# Patient Record
Sex: Female | Born: 1935 | Race: White | Hispanic: No | Marital: Married | Smoking: Never smoker
Health system: Southern US, Community
[De-identification: ages and names within clinical notes are randomized; demographics above are authoritative.]

## PROBLEM LIST (undated history)

## (undated) DIAGNOSIS — C4491 Basal cell carcinoma of skin, unspecified: Secondary | ICD-10-CM

## (undated) DIAGNOSIS — C4492 Squamous cell carcinoma of skin, unspecified: Secondary | ICD-10-CM

## (undated) DIAGNOSIS — E785 Hyperlipidemia, unspecified: Secondary | ICD-10-CM

## (undated) DIAGNOSIS — N200 Calculus of kidney: Secondary | ICD-10-CM

## (undated) DIAGNOSIS — I10 Essential (primary) hypertension: Secondary | ICD-10-CM

## (undated) DIAGNOSIS — C4490 Unspecified malignant neoplasm of skin, unspecified: Secondary | ICD-10-CM

## (undated) HISTORY — DX: Calculus of kidney: N20.0

## (undated) HISTORY — DX: Basal cell carcinoma of skin, unspecified: C44.91

## (undated) HISTORY — PX: CATARACT EXTRACTION: SUR2

## (undated) HISTORY — PX: OOPHORECTOMY: SHX86

## (undated) HISTORY — PX: BUNIONECTOMY: SHX129

## (undated) HISTORY — PX: ABDOMINAL HYSTERECTOMY: SHX81

## (undated) HISTORY — DX: Squamous cell carcinoma of skin, unspecified: C44.92

## (undated) HISTORY — DX: Hyperlipidemia, unspecified: E78.5

## (undated) HISTORY — DX: Essential (primary) hypertension: I10

## (undated) HISTORY — DX: Unspecified malignant neoplasm of skin, unspecified: C44.90

---

## 2004-01-14 ENCOUNTER — Ambulatory Visit: Payer: Self-pay | Admitting: Internal Medicine

## 2005-02-17 ENCOUNTER — Ambulatory Visit: Payer: Self-pay | Admitting: Internal Medicine

## 2006-03-02 ENCOUNTER — Ambulatory Visit: Payer: Self-pay | Admitting: Internal Medicine

## 2006-06-07 ENCOUNTER — Ambulatory Visit: Payer: Self-pay | Admitting: Gastroenterology

## 2007-03-04 ENCOUNTER — Ambulatory Visit: Payer: Self-pay | Admitting: Internal Medicine

## 2008-03-06 ENCOUNTER — Ambulatory Visit: Payer: Self-pay | Admitting: Internal Medicine

## 2009-03-07 ENCOUNTER — Ambulatory Visit: Payer: Self-pay | Admitting: Internal Medicine

## 2010-03-13 ENCOUNTER — Ambulatory Visit: Payer: Self-pay | Admitting: Internal Medicine

## 2011-02-10 ENCOUNTER — Ambulatory Visit: Payer: Self-pay | Admitting: Internal Medicine

## 2011-03-25 ENCOUNTER — Ambulatory Visit: Payer: Self-pay | Admitting: Internal Medicine

## 2012-03-29 ENCOUNTER — Ambulatory Visit: Payer: Self-pay

## 2012-08-01 ENCOUNTER — Ambulatory Visit: Payer: Self-pay

## 2012-08-02 LAB — URINALYSIS, COMPLETE
Bilirubin,UR: NEGATIVE
Glucose,UR: NEGATIVE mg/dL (ref 0–75)
Ketone: NEGATIVE
Nitrite: NEGATIVE
RBC,UR: 8 /HPF (ref 0–5)
Specific Gravity: 1.02 (ref 1.003–1.030)
Squamous Epithelial: 3
Transitional Epi: <1
WBC UR: 59 /HPF (ref 0–5)

## 2012-08-02 LAB — CBC WITH DIFFERENTIAL/PLATELET
Eosinophil #: 0.1 10*3/uL (ref 0.0–0.7)
Eosinophil %: 1 %
HCT: 38.1 % (ref 35.0–47.0)
HGB: 12.9 g/dL (ref 12.0–16.0)
Lymphocyte #: 2.3 10*3/uL (ref 1.0–3.6)
Lymphocyte %: 24.8 %
MCHC: 33.9 g/dL (ref 32.0–36.0)
Neutrophil %: 64.6 %
Platelet: 193 10*3/uL (ref 150–440)
RBC: 4.14 10*6/uL (ref 3.80–5.20)
RDW: 12.2 % (ref 11.5–14.5)
WBC: 9.2 10*3/uL (ref 3.6–11.0)

## 2012-08-03 ENCOUNTER — Observation Stay: Payer: Self-pay

## 2012-08-03 LAB — BASIC METABOLIC PANEL
Anion Gap: 6 — ABNORMAL LOW (ref 7–16)
BUN: 40 mg/dL — ABNORMAL HIGH (ref 7–18)
Calcium, Total: 8 mg/dL — ABNORMAL LOW (ref 8.5–10.1)
Chloride: 113 mmol/L — ABNORMAL HIGH (ref 98–107)
EGFR (Non-African Amer.): 23 — ABNORMAL LOW
Glucose: 97 mg/dL (ref 65–99)
Sodium: 142 mmol/L (ref 136–145)

## 2012-08-03 LAB — CBC WITH DIFFERENTIAL/PLATELET
Basophil #: 0.1 10*3/uL (ref 0.0–0.1)
Basophil %: 1.1 %
Eosinophil #: 0.1 10*3/uL (ref 0.0–0.7)
HCT: 35.6 % (ref 35.0–47.0)
HGB: 11.9 g/dL — ABNORMAL LOW (ref 12.0–16.0)
Lymphocyte #: 1.8 10*3/uL (ref 1.0–3.6)
MCH: 30.9 pg (ref 26.0–34.0)
MCV: 92 fL (ref 80–100)
Neutrophil #: 2.6 10*3/uL (ref 1.4–6.5)
Neutrophil %: 49.8 %
Platelet: 173 10*3/uL (ref 150–440)
WBC: 5.3 10*3/uL (ref 3.6–11.0)

## 2012-08-31 ENCOUNTER — Ambulatory Visit: Payer: Self-pay

## 2012-09-06 ENCOUNTER — Ambulatory Visit: Payer: Self-pay

## 2012-10-28 ENCOUNTER — Ambulatory Visit: Payer: Self-pay

## 2012-11-01 ENCOUNTER — Ambulatory Visit: Payer: Self-pay

## 2013-04-20 ENCOUNTER — Ambulatory Visit: Payer: Self-pay

## 2014-04-23 ENCOUNTER — Ambulatory Visit: Payer: Self-pay | Admitting: Physician Assistant

## 2014-07-06 NOTE — Consult Note (Signed)
PATIENT NAME:  Ann Hebert, Ann Hebert MR#:  [id number] DATE OF BIRTH:  11-27-1934  DATE OF CONSULTATION:  08/02/2012  REFERRING PHYSICIAN:   Ola Spurr, MD CONSULTING PHYSICIAN:  Otelia Limes. Yves Dill, MD  REASON FOR CONSULTATION:  Kidney stone.   HISTORY OF PRESENT ILLNESS: The patient is a 80 year old Caucasian female with sudden onset of right flank pain radiating anteriorly associated with nausea and vomiting yesterday. She denied hematuria or dysuria. She denied fever and chills. She had a noncontrast CT scan done yesterday which indicated the presence of a distal 3 mm stone with moderate hydronephrosis. The patient also had a left lower pole renal stone. Admission was prompted by slight rise of her creatinine. She has had no prior stone episodes. She denied history of renal insufficiency.   PAST MEDICAL HISTORY:   THE PATIENT IS ALLERGIC TO PHENOBARBITAL.   CURRENT HOME MEDICATIONS:  Include benazepril, simvastatin, calcium with D, aspirin and Zyrtec.   PAST SURGICAL HISTORY:  Included partially hysterectomy with appendectomy in 1960, left oophorectomy in 1963, removal of bunions from both feet 2004, bilateral cataract surgery 2008.   SOCIAL HISTORY:  She denied tobacco or alcohol use.   FAMILY HISTORY:  Noncontributory. No history of renal disease.   PAST AND CURRENT MEDICAL CONDITIONS:  1.  Hypercholesterolemia.  2.  Hypertension.  3.  Osteoporosis.  4.  Allergic rhinitis.   REVIEW OF SYSTEMS:  The patient denied history of frequent urinary tract infections or hematuria. She denied urinary incontinence or difficulty emptying her bladder.   PHYSICAL EXAMINATION: GENERAL:  A well-nourished white female in no distress.  ABDOMEN:  Soft. No CVA tenderness.   CT scan report dated May 19 was reviewed.  Hematocrit was 38% with white cell count of 9200.  Creatinine results are currently not available to me.   IMPRESSION: 1.  Right ureterolithiasis.  2.  Left nephrolithiasis.    SUGGESTIONS: 1.  IV hydration. 2.  Start Flomax 0.4 mg per day for urinary stone expulsive therapy.  3.  Discontinue aspirin.  4.  Zuplenz 0.8 mg sublingual every 6 hours as needed for nausea.  5.  Nucynta 50 mg to 100 mg every 6 hours for pain. 6.  Repeat creatinine in the morning. 7.  The patient appears to be pain-free at this time, so she probably could go home in the morning and follow up in the office. This stone has a very high likelihood of the passing spontaneously without intervention.   ____________________________ Otelia Limes. Yves Dill, MD mrw:ce D: 08/02/2012 17:11:35 ET T: 08/02/2012 17:32:22 ET JOB#: [id number]  cc: Otelia Limes. Yves Dill, MD, <Dictator> Cheral Marker. Ola Spurr, MD  Royston Cowper MD ELECTRONICALLY SIGNED 08/03/2012 8:40

## 2014-07-06 NOTE — Discharge Summary (Signed)
PATIENT NAME:  Ann Hebert, Ann Hebert MR#:  [id number] DATE OF BIRTH:  12-Jun-1934  DATE OF ADMISSION:  08/03/2012 DATE OF DISCHARGE:  08/03/2012  PRIMARY CARE PHYSICIAN: Adrian Prows.   CONSULTING UROLOGIST: Dr. Yves Dill.   DISCHARGE DIAGNOSES: 1.  Acute renal failure, improving.  2.  Nephrolithiasis, with hydronephrosis.  3.  Hypertension.  4.  Pneumonia.  HISTORY OF PRESENT ILLNESS: Please see admission history and physical. Briefly, this is a 80 year old previously healthy woman, came in with an acute onset of abdominal and flank pain. Found to have nephrolithiasis, with right hydronephrosis. She was initially treated as an outpatient however her creatinine bumped from 1.4 to 1.8 to 2.6, so she was admitted for IV fluids.   HOSPITAL COURSE BY ISSUE:  1.  Nephrolithiasis: She was seen by Dr. Yves Dill. She actually ended up passing a stone with IV hydration and Flomax. The stone was sent for analysis.  2.  Acute renal failure: Creatinine decreased after 1 day of IV fluids from 2.6 to 2.0. We held her benazepril.  3. Hypertension: Held her benazepril. Blood pressure remains stable. Will follow this up as an outpatient.  4. Pneumonia: She had a chronic cough for the last several weeks. Chest x-ray showed right upper lobe possible developing infiltrate. She was started on levofloxacin.   DISCHARGE MEDICATIONS: 1.  Calcium 600/200 twice a day.  2.  Aspirin 81 once a day.  3.  Zyrtec 10 mg once a day.  4.  Simvastatin 20 mg once a day.  5.  Levofloxacin 500 once a day.   Discharge to home with low-sodium, regular consistency diet. She may return to work as tolerated.   Follow up for basic metabolic panel to check her renal function on Friday, and to be seen to monitor her blood pressure.   This discharge took 35 minutes.    ____________________________ Cheral Marker. Ola Spurr, MD dpf:dm D: 08/03/2012 10:47:06 ET T: 08/03/2012 10:57:10 ET JOB#: [id number]  cc: Cheral Marker. Ola Spurr, MD,  <Dictator> DAVID Ola Spurr MD ELECTRONICALLY SIGNED 08/17/2012 12:45

## 2014-07-06 NOTE — H&P (Signed)
PATIENT NAME:  Ann Hebert, Ann Hebert MR#:  [id number] DATE OF BIRTH:  10-Dec-1934  DATE OF ADMISSION:  08/02/2012  DATE OF ADMISSION: 08/02/2012.   REASON FOR ADMISSION: Acute renal failure, nephrolithiasis and hydronephrosis.   HISTORY OF PRESENT ILLNESS: This is a very pleasant 80 year old female who is quite healthy and has a history of hypertension and hyperlipidemia, both well controlled. She was seen in clinic yesterday, on the 19th, with the sudden onset of right lower quadrant back and abdominal pain as well as difficulty urinating with decreased stream. She vomited twice that morning. She had no fevers, but did have a mild chill and a mild cough. Her urinalysis at that time showed too numerous to count red blood cells, and a CT done without contrast showed a 3 mm right ureterovesical junction stone. She did have moderate hydronephrosis on the right as well. She had been given a shot of Toradol in the clinic before the CT scan for pain control. It was noted her creatinine had increased to 1.8 from baseline of 1.3. The patient did not want to be admitted at that time, but wanted to try forcing fluids.   She was sent home yesterday, encouraged to increase fluid intake. She came today for a BMET, and her creatinine is elevated again to 2.6. She persists with right upper quadrant and right flank pain. She has had no further vomiting. She is trying to force fluids. Her cough does persist, but no shortness or breath or chest pain. She does still have some difficulty urinating.   PAST MEDICAL HISTORY:  1. Hypertension, well controlled.  2. Hyperlipidemia.  3. Osteoporosis.  4. Mild anxiety.  5. Rotator cuff syndrome.   PAST SURGICAL HISTORY: Bilateral oophorectomy. :   SOCIAL HISTORY: She is married for greater than 55 years. She has 2 children and 4 grandchildren. 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 She does not smoke or drink.   FAMILY HISTORY: Positive for  hypertension in her mother, father and siblings and a stroke in her mother.   REVIEW OF SYSTEMS: Eleven systems reviewed and negative except as per HPI.   CURRENT MEDICATIONS: Currently the patient takes:  1. Benazepril 20 mg once a day.  2. Simvastatin 20 mg every night.  3. Calcium with vitamin D 600/200 one tablet twice a day.  4. Aspirin 81 mg once a day.  5. Ambien 5 mg q.h.s.   6. Flonase p.r.n.   ALLERGIES: THE PATIENT IS ALLERGIC TO PHENOBARBITAL.   PHYSICAL EXAMINATION:  VITAL SIGNS: Weight 145, temperature 97.1, blood pressure 152/62, pulse 59.  GENERAL: She is pleasant, interactive, in mild distress for right flank pain.  HEENT: Pupils equal, round and reactive to light and accommodation. Extraocular movements are intact. Sclerae anicteric. Oropharynx is clear.  NECK: Supple.  HEART: Regular, with no murmurs, rubs or gallops.  LUNGS: Clear to auscultation bilaterally.  ABDOMEN: Soft. Mildly tender to palpation in right upper quadrant, mild flank tenderness.  EXTREMITIES: No clubbing, cyanosis or edema.  NEUROLOGIC: She is alert and oriented x3. Grossly nonfocal neuro exam.   DATA: Yesterday's comprehensive metabolic panel showed a BUN of 37, creatinine 1.8. This morning, BUN was 44, creatinine 2.6. Complete blood count yesterday: White blood count 11.1, hemoglobin 14.3, platelets 234. Urine culture is pending from yesterday. Urinalysis yesterday showed large blood, small leukocyte esterase, 5 to 15 white cells, too numerous red cells. CT scan done yesterday as an outpatient was done without contrast for  evaluation of renal stone. This revealed moderate hydronephrosis on the right which appears to be secondary to a 3 mm diameter stone at the ureterovesical junction. There was also 1 cm diameter mid pole cyst posteriorly on the right. The left kidney had some peripelvic cysts, but no hydronephrosis. There was a nonobstructing 3 mm diameter lower pole stone on the left. The urinary  bladder was partially distended, but no stones were noted in the lumen. There were phleboliths within the pelvis. There was sigmoid diverticulosis, but no diverticulitis. The appendix was not discretely identified. The lung bases were clear.   IMPRESSION: A 80 year old with acute right hydronephrosis likely from a 3 mm renal stone lodged at the ureterovesical junction. She has increased her creatinine from 1.4 to 1.8 and now 2.6 despite forcing fluids. Her white blood count was also slightly elevated at 11. Urine culture is pending.   I spoke with Dr. Yves Dill, who is on call for urology. He says it is rather unusual that her creatinine would bump so much with the unilateral hydronephrosis. Of note, she does have a left stone, but this does not appear to be obstructing. She also received a dose of Toradol, which may be explaining some of this.   PLAN:  1. Will admit the patient for IV hydration with 150 mL of normal saline every hour. Will do strict ins and outs and strain her urine for any stones.  2. Repeat a CBC today to ensure that the white blood count has been increased. If it continues to increase while the urine culture is pending, we can give an antibiotic, but will hold on this at this point.  3. Consult urology.  4. Will do a bladder scan on admission. If she is retaining urine, will place a Foley catheter to decompress.  5. For her cough, will check a chest x-ray.  6. We will hold on repeat imaging at this point.  7. For pain control, will do Tylenol and can use Vicodin p.r.n. Will avoid NSAIDs given the renal insufficiency.  8. If renal function worsens, will consult nephrology.  9. Deep vein thrombosis prophylaxis. Will have the patient use TED stockings.     ____________________________ Cheral Marker. Ola Spurr, MD dpf:OSi D: 08/02/2012 14:00:57 ET T: 08/02/2012 14:20:23 ET JOB#: [id number]  cc: Cheral Marker. Ola Spurr, MD, <Dictator> Reade Trefz Ola Spurr MD ELECTRONICALLY SIGNED 08/17/2012  12:45

## 2014-07-06 NOTE — Consult Note (Signed)
Brief Consult Note: Diagnosis: Ureterolithiasis.   Patient was seen by consultant.   Consult note dictated.   Recommend further assessment or treatment.   Orders entered.   Discussed with Attending MD.   Comments: IV hydration. Start Flomax. Very good chance that she will pas this stone. Strain urine. Zuplenz and nucynta. Avoid aspirin. Probably can go home soon. Follow-up in the office.  Electronic Signatures: Royston Cowper (MD)  (Signed 20-May-14 16:58)  Authored: Brief Consult Note   Last Updated: 20-May-14 16:58 by Royston Cowper (MD)

## 2015-02-04 IMAGING — CT CT STONE STUDY
1 of 2 series · 15 of 32 positions shown, 19 images · non-contrast
Comparison: none

REASON FOR EXAM: STAT CallReport Release pt back to office  [id number]
unable to urinate  UA sho...
COMMENTS:

[Series 2: 3mm soft tissue · axial · 0.68mm/px · z∈[-472,-100]mm · 15 of 136 slices shown, 19 images]
[im 6/136  soft-tissue]
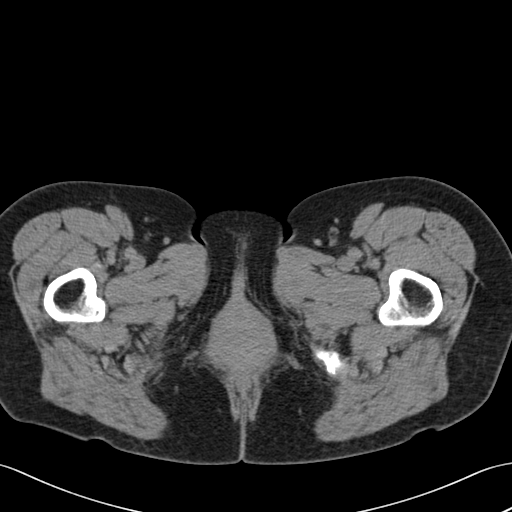
[im 6/136  bone]
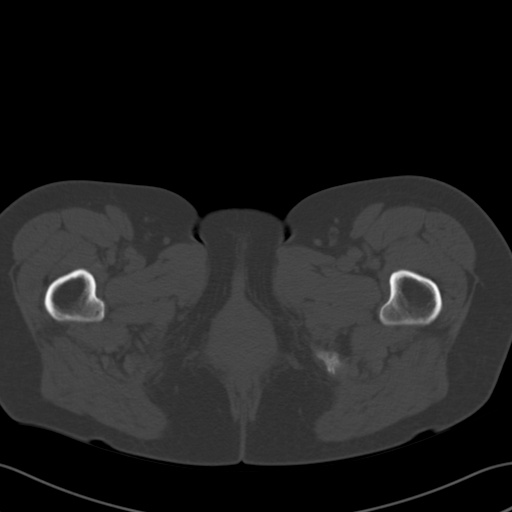
[im 17/136  soft-tissue]
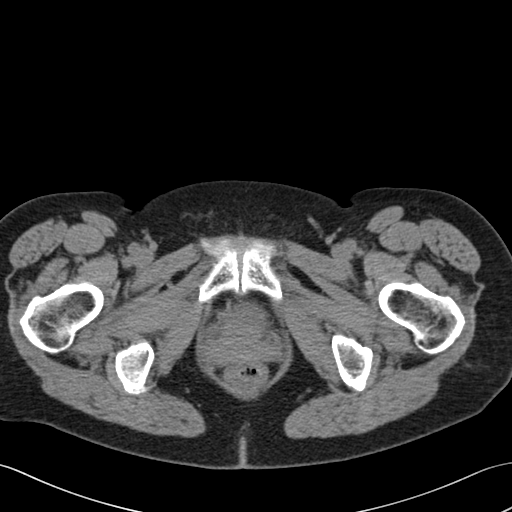
[im 29/136  soft-tissue]
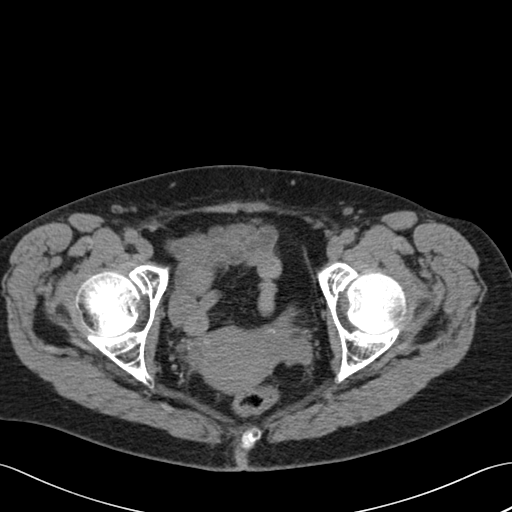
[im 40/136  soft-tissue]
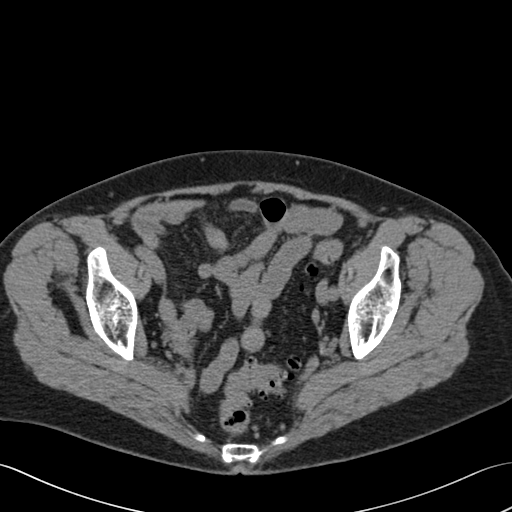
[im 46/136  soft-tissue]
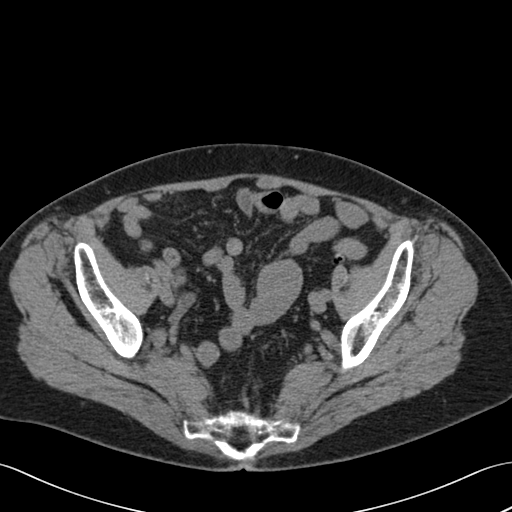
[im 57/136  soft-tissue]
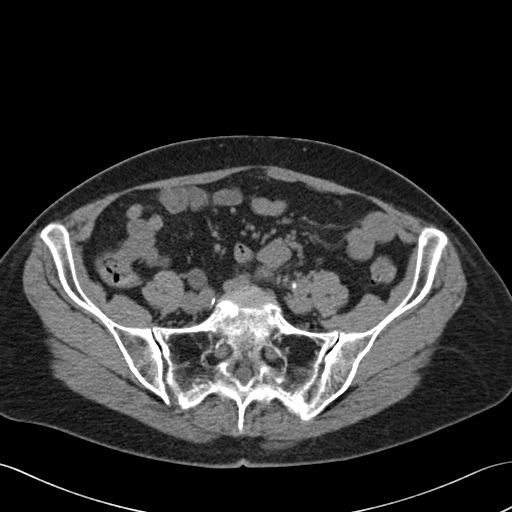
[im 68/136  soft-tissue]
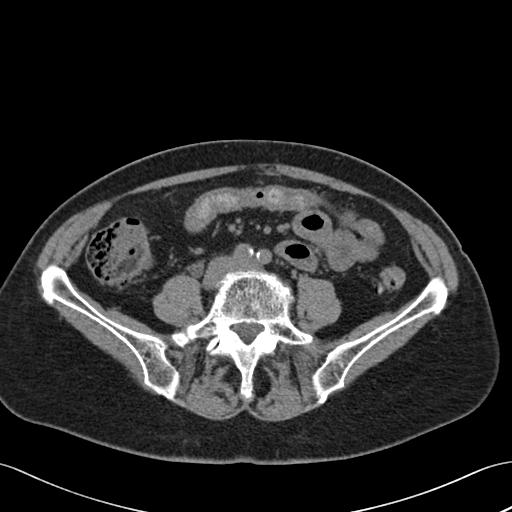
[im 79/136  soft-tissue]
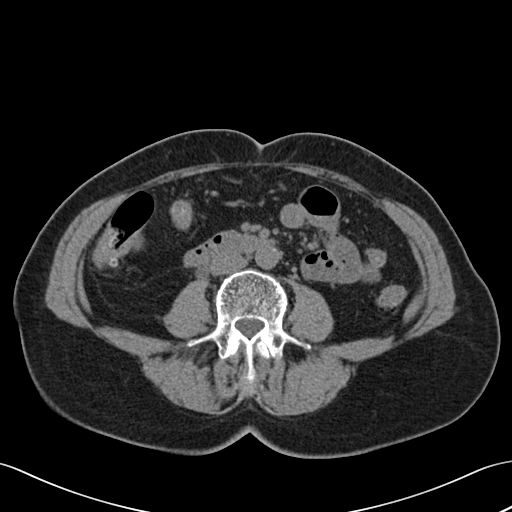
[im 91/136  soft-tissue]
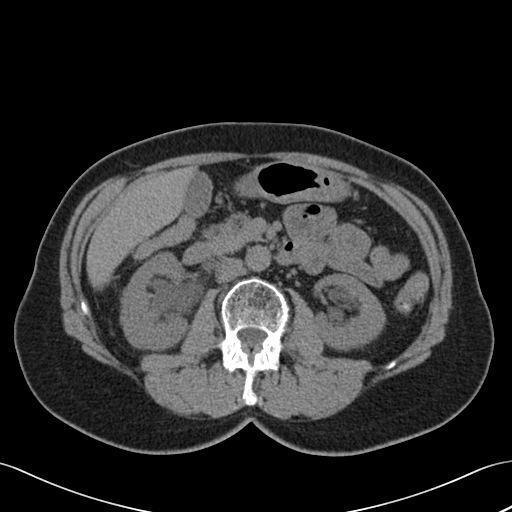
[im 91/136  bone]
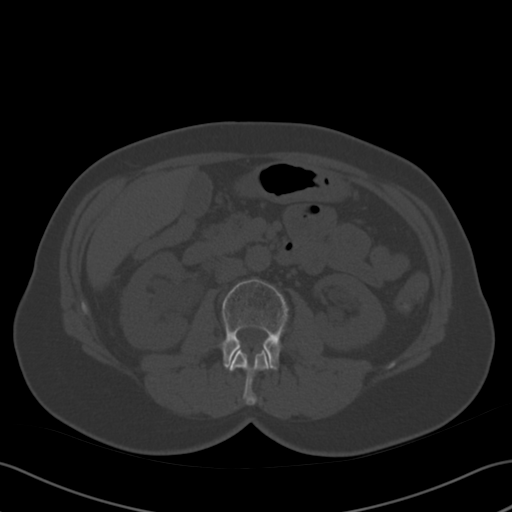
[im 96/136  soft-tissue]
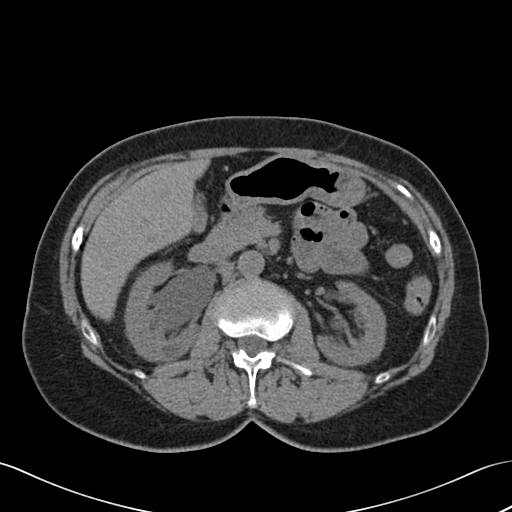
[im 107/136  soft-tissue]
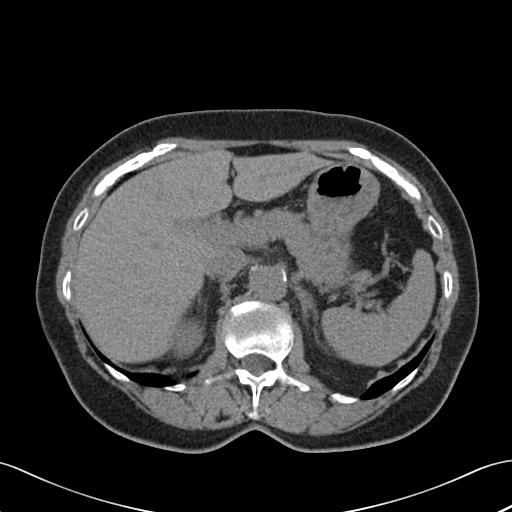
[im 113/136  lung]
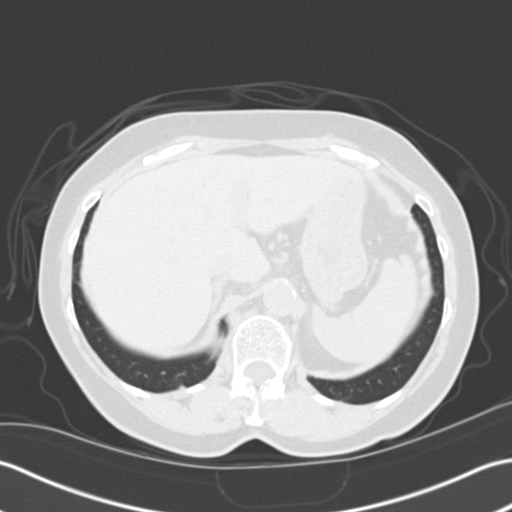
[im 119/136  soft-tissue]
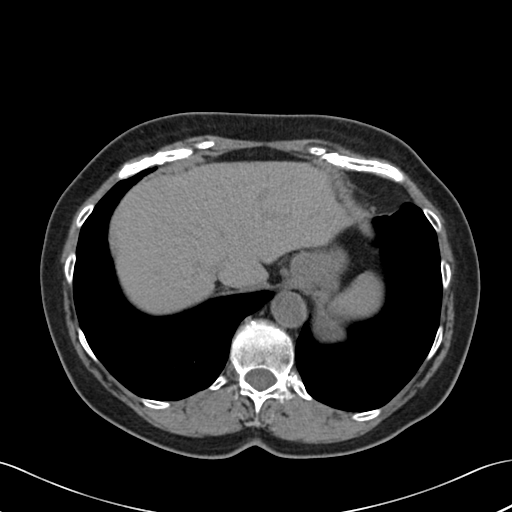
[im 119/136  lung]
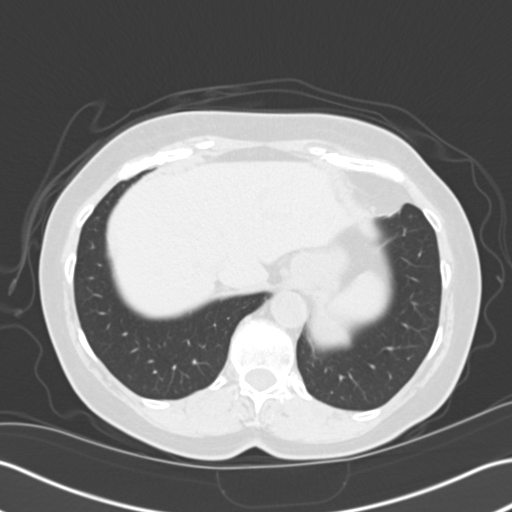
[im 124/136  lung]
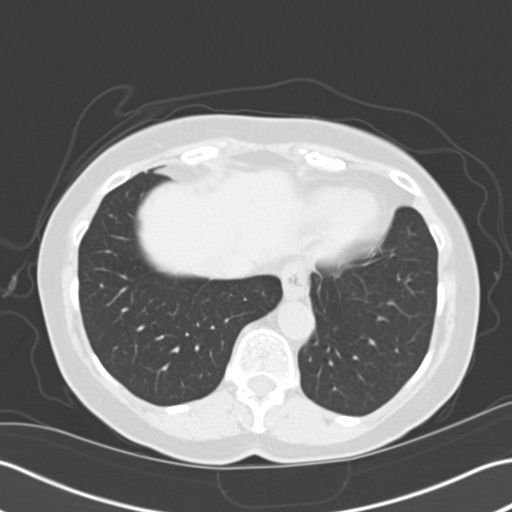
[im 130/136  soft-tissue]
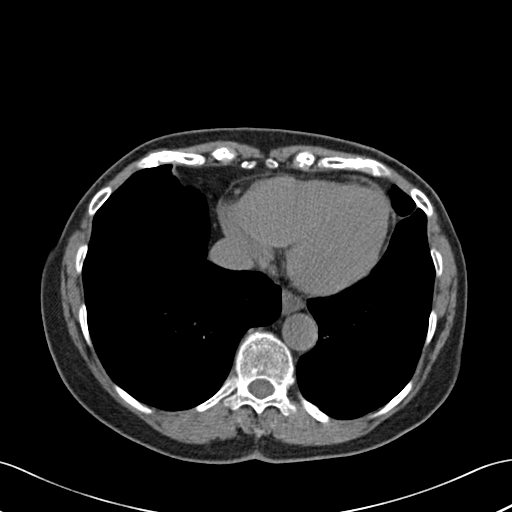
[im 130/136  lung]
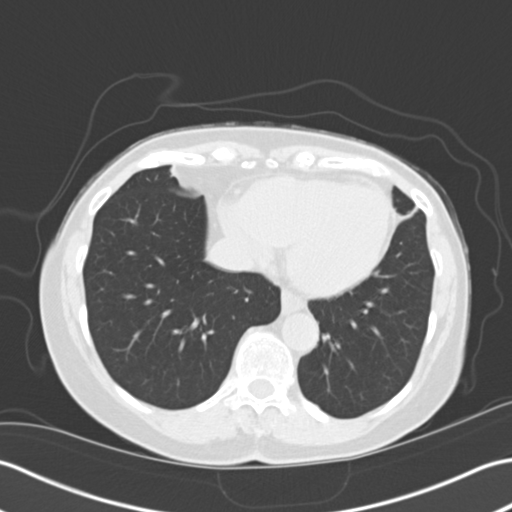

[15 of 32 positions shown; findings below may reference images not displayed]

PROCEDURE:     CT  - CT ABDOMEN /PELVIS WO (STONE)  - August 01, 2012  [DATE]

RESULT:     Axial noncontrast CT scanning was performed through the abdomen
and pelvis with reconstructions at 3 mm intervals and slice thicknesses.
Review of multiplanar reconstructed images was performed separately on the
VIA monitor.

There is moderate hydronephrosis on the right which appears to be secondary
to a 3 mm diameter stone at the ureterovesical junction. No other stones on
the right are demonstrated. There is likely a 1 cm diameter mid pole cyst
posteriorly on the right.  The left kidney exhibits parapelvic cysts but no
hydronephrosis. There is a nonobstructing 3 mm diameter lower pole stone on
the left. The urinary bladder is partially distended. I do not see stones in
the bladder lumen. There are phleboliths within the pelvis.

There is sigmoid diverticulosis without evidence of acute diverticulitis. No
acute bowel abnormality is demonstrated. The appendix is not discretely
identified. The liver, gallbladder, pancreas, spleen, nondistended stomach,
and adrenal glands are normal in appearance. The caliber of the abdominal
aorta is normal. There is a small hiatal hernia. There is no inguinal nor
umbilical hernia.

The lung bases are clear. The lumbar vertebral bodies are preserved in
height.
IMPRESSION: 1. There is moderate right-sided hydronephrosis secondary to a 3 mm diameter
right ureterovesical junction stone. There are no other stones demonstrated
on the right. The left kidney demonstrates a nonobstructing 3 mm diameter
lower pole stone.
2. There is sigmoid diverticulosis without evidence of acute diverticulitis.
3. There is no evidence of acute hepatobiliary abnormality.

A preliminary report was called to Dr. [REDACTED] at [DATE] p.m. on 19

[REDACTED]

## 2015-02-05 IMAGING — CR DG CHEST 2V
1 series · 2 of 2 positions shown · non-contrast
Comparison: none

REASON FOR EXAM: cough
COMMENTS:

[Series 1: w chest pa · 0.14mm/px · 2 of 2 slices shown]
[im 1/2]
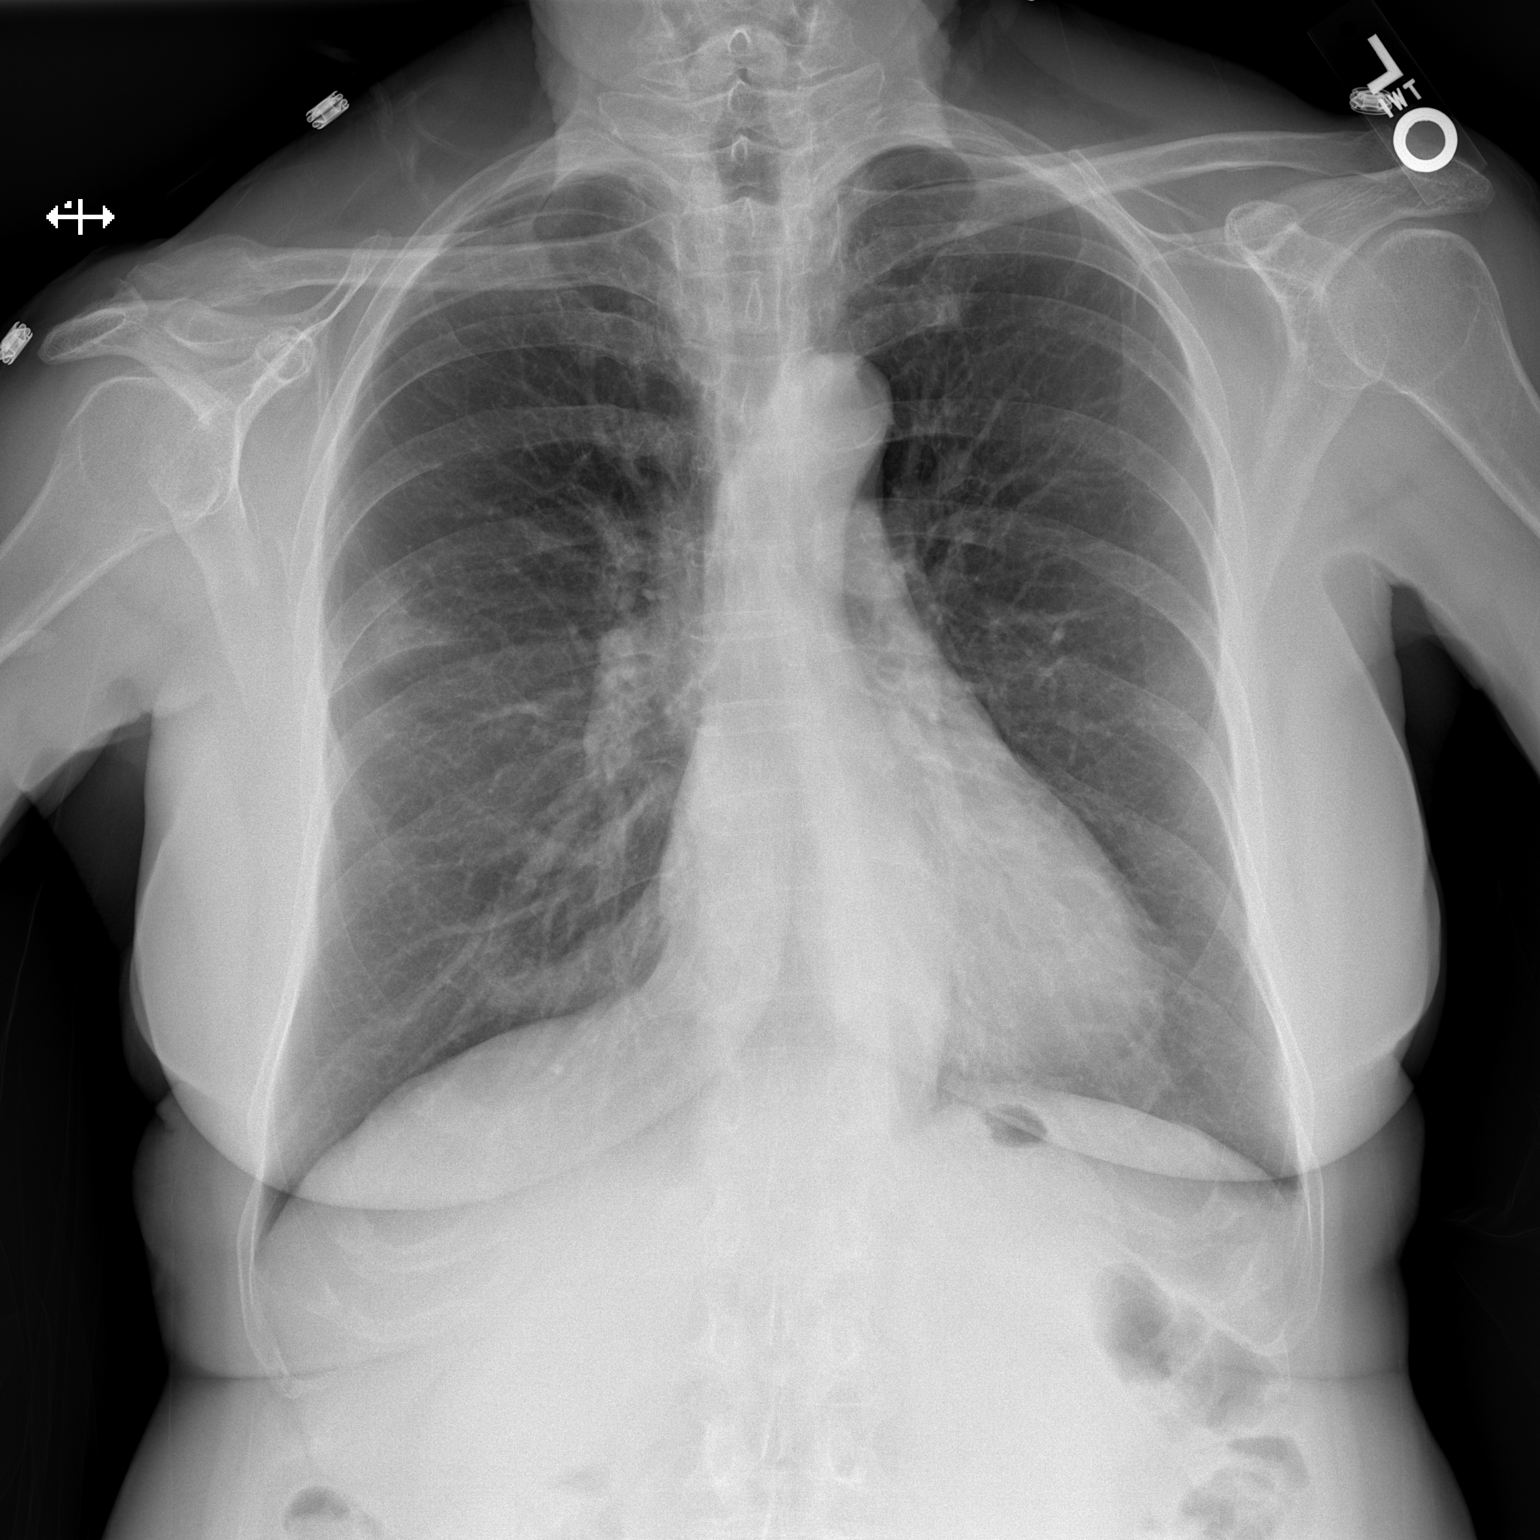
[im 2/2]
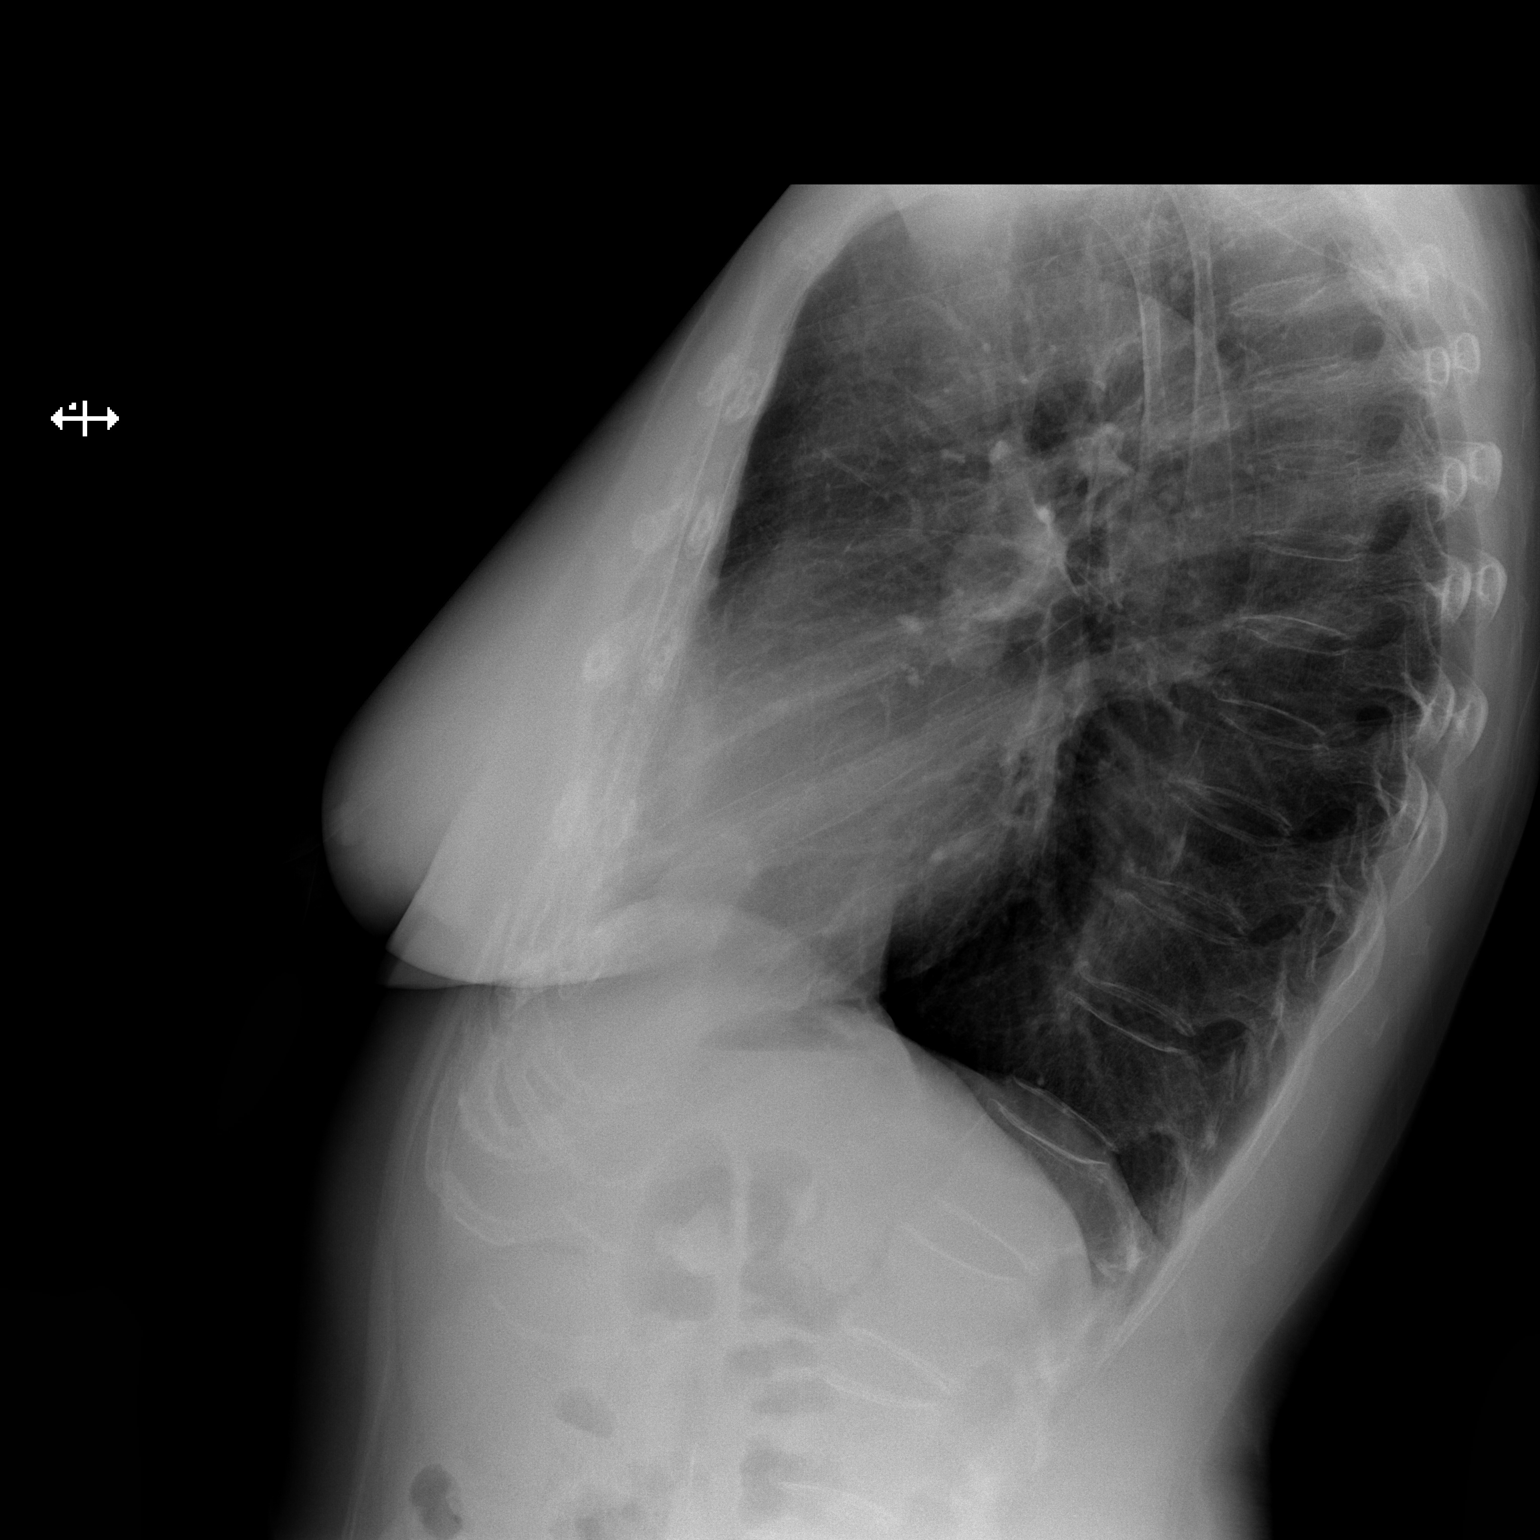

[2 of 2 positions shown; findings below may reference images not displayed]

PROCEDURE:     DXR - DXR CHEST PA (OR AP) AND LATERAL  - August 02, 2012  [DATE]

RESULT:     There are no previous studies for comparison.

There is hazy infiltrate inferiorly in the right upper lobe. The lungs are
well-expanded. There is no pleural effusion or pneumothorax. The cardiac
silhouette is normal in size. The pulmonary vascularity is not engorged. The
bony thorax exhibits no acute abnormality.
IMPRESSION: The findings are consistent with atelectasis or developing
pneumonia in the inferior aspect of the right upper lobe. Followup films
following therapy are recommended to assure clearing.

[REDACTED]

## 2015-02-20 ENCOUNTER — Other Ambulatory Visit: Payer: Self-pay | Admitting: Physician Assistant

## 2015-02-20 ENCOUNTER — Encounter: Payer: Self-pay | Admitting: *Deleted

## 2015-02-20 ENCOUNTER — Ambulatory Visit
Admission: RE | Admit: 2015-02-20 | Discharge: 2015-02-20 | Disposition: A | Discharge status: Home / Self Care | Payer: Medicare Other | Source: Ambulatory Visit | Attending: Physician Assistant | Admitting: Physician Assistant

## 2015-02-20 DIAGNOSIS — N201 Calculus of ureter: Secondary | ICD-10-CM | POA: Diagnosis not present

## 2015-02-20 DIAGNOSIS — F419 Anxiety disorder, unspecified: Secondary | ICD-10-CM | POA: Insufficient documentation

## 2015-02-20 DIAGNOSIS — N200 Calculus of kidney: Secondary | ICD-10-CM | POA: Diagnosis not present

## 2015-02-20 DIAGNOSIS — K5730 Diverticulosis of large intestine without perforation or abscess without bleeding: Secondary | ICD-10-CM | POA: Diagnosis not present

## 2015-02-20 DIAGNOSIS — E782 Mixed hyperlipidemia: Secondary | ICD-10-CM | POA: Insufficient documentation

## 2015-02-20 DIAGNOSIS — I10 Essential (primary) hypertension: Secondary | ICD-10-CM | POA: Insufficient documentation

## 2015-02-20 DIAGNOSIS — R319 Hematuria, unspecified: Secondary | ICD-10-CM | POA: Diagnosis present

## 2015-02-20 DIAGNOSIS — M810 Age-related osteoporosis without current pathological fracture: Secondary | ICD-10-CM | POA: Insufficient documentation

## 2015-02-20 DIAGNOSIS — R109 Unspecified abdominal pain: Secondary | ICD-10-CM | POA: Diagnosis present

## 2015-02-21 ENCOUNTER — Ambulatory Visit (INDEPENDENT_AMBULATORY_CARE_PROVIDER_SITE_OTHER): Payer: Medicare Other | Admitting: Obstetrics and Gynecology

## 2015-02-21 ENCOUNTER — Encounter: Payer: Self-pay | Admitting: Obstetrics and Gynecology

## 2015-02-21 VITALS — BP 160/73 | HR 63 | Resp 16 | Ht 65.5 in | Wt 149.1 lb

## 2015-02-21 DIAGNOSIS — N200 Calculus of kidney: Secondary | ICD-10-CM

## 2015-02-21 LAB — URINALYSIS, COMPLETE
Bilirubin, UA: NEGATIVE
GLUCOSE, UA: NEGATIVE
KETONES UA: NEGATIVE
NITRITE UA: NEGATIVE
Protein, UA: NEGATIVE
Specific Gravity, UA: 1.015 (ref 1.005–1.030)
Urobilinogen, Ur: 0.2 mg/dL (ref 0.2–1.0)
pH, UA: 5 (ref 5.0–7.5)

## 2015-02-21 LAB — MICROSCOPIC EXAMINATION: RBC, UA: >30 /hpf — ABNORMAL HIGH (ref 0–?)

## 2015-02-21 MED ORDER — TAMSULOSIN HCL 0.4 MG PO CAPS: 0.4000 mg | ORAL_CAPSULE | Freq: Every day | ORAL | Status: AC

## 2015-02-21 NOTE — Patient Instructions (Signed)
Dietary Guidelines to Help Prevent Kidney Stones °Your risk of kidney stones can be decreased by adjusting the foods you eat. The most important thing you can do is drink enough fluid. You should drink enough fluid to keep your urine clear or pale yellow. The following guidelines provide specific information for the type of kidney stone you have had. °GUIDELINES ACCORDING TO TYPE OF KIDNEY STONE °Calcium Oxalate Kidney Stones °· Reduce the amount of salt you eat. Foods that have a lot of salt cause your body to release excess calcium into your urine. The excess calcium can combine with a substance called oxalate to form kidney stones. °· Reduce the amount of animal protein you eat if the amount you eat is excessive. Animal protein causes your body to release excess calcium into your urine. Ask your dietitian how much protein from animal sources you should be eating. °· Avoid foods that are high in oxalates. If you take vitamins, they should have less than 500 mg of vitamin C. Your body turns vitamin C into oxalates. You do not need to avoid fruits and vegetables high in vitamin C. °Calcium Phosphate Kidney Stones °· Reduce the amount of salt you eat to help prevent the release of excess calcium into your urine. °· Reduce the amount of animal protein you eat if the amount you eat is excessive. Animal protein causes your body to release excess calcium into your urine. Ask your dietitian how much protein from animal sources you should be eating. °· Get enough calcium from food or take a calcium supplement (ask your dietitian for recommendations). Food sources of calcium that do not increase your risk of kidney stones include: °· Broccoli. °· Dairy products, such as cheese and yogurt. °· Pudding. °Uric Acid Kidney Stones °· Do not have more than 6 oz of animal protein per day. °FOOD SOURCES °Animal Protein Sources °· Meat (all types). °· Poultry. °· Eggs. °· Fish, seafood. °Foods High in Salt °· Salt seasonings. °· Soy  sauce. °· Teriyaki sauce. °· Cured and processed meats. °· Salted crackers and snack foods. °· Fast food. °· Canned soups and most canned foods. °Foods High in Oxalates °· Grains: °· Amaranth. °· Barley. °· Grits. °· Wheat germ. °· Bran. °· Buckwheat flour. °· All bran cereals. °· Pretzels. °· Whole wheat bread. °· Vegetables: °· Beans (wax). °· Beets and beet greens. °· Collard greens. °· Eggplant. °· Escarole. °· Leeks. °· Okra. °· Parsley. °· Rutabagas. °· Spinach. °· Swiss chard. °· Tomato paste. °· Fried potatoes. °· Sweet potatoes. °· Fruits: °· Red currants. °· Figs. °· Kiwi. °· Rhubarb. °· Meat and Other Protein Sources: °· Beans (dried). °· Soy burgers and other soybean products. °· Miso. °· Nuts (peanuts, almonds, pecans, cashews, hazelnuts). °· Nut butters. °· Sesame seeds and tahini (paste made of sesame seeds). °· Poppy seeds. °· Beverages: °· Chocolate drink mixes. °· Soy milk. °· Instant iced tea. °· Juices made from high-oxalate fruits or vegetables. °· Other: °· Carob. °· Chocolate. °· Fruitcake. °· Marmalades. °  °This information is not intended to replace advice given to you by your health care provider. Make sure you discuss any questions you have with your health care provider. °  °Document Released: 06/27/2010 Document Revised: 03/07/2013 Document Reviewed: 01/27/2013 °Elsevier Interactive Patient Education ©2016 [employer] ° °Kidney Stones °Kidney stones (urolithiasis) are deposits that form inside your kidneys. The intense pain is caused by the stone moving through the urinary tract. When the stone moves, the ureter   goes into spasm around the stone. The stone is usually passed in the urine.  °CAUSES  °· A disorder that makes certain neck glands produce too much parathyroid hormone (primary hyperparathyroidism). °· A buildup of uric acid crystals, similar to gout in your joints. °· Narrowing (stricture) of the ureter. °· A kidney obstruction present at birth (congenital  obstruction). °· Previous surgery on the kidney or ureters. °· Numerous kidney infections. °SYMPTOMS  °· Feeling sick to your stomach (nauseous). °· Throwing up (vomiting). °· Blood in the urine (hematuria). °· Pain that usually spreads (radiates) to the groin. °· Frequency or urgency of urination. °DIAGNOSIS  °· Taking a history and physical exam. °· Blood or urine tests. °· CT scan. °· Occasionally, an examination of the inside of the urinary bladder (cystoscopy) is performed. °TREATMENT  °· Observation. °· Increasing your fluid intake. °· Extracorporeal shock wave lithotripsy--This is a noninvasive procedure that uses shock waves to break up kidney stones. °· Surgery may be needed if you have severe pain or persistent obstruction. There are various surgical procedures. Most of the procedures are performed with the use of small instruments. Only small incisions are needed to accommodate these instruments, so recovery time is minimized. °The size, location, and chemical composition are all important variables that will determine the proper choice of action for you. Talk to your health care provider to better understand your situation so that you will minimize the risk of injury to yourself and your kidney.  °HOME CARE INSTRUCTIONS  °· Drink enough water and fluids to keep your urine clear or pale yellow. This will help you to pass the stone or stone fragments. °· Strain all urine through the provided strainer. Keep all particulate matter and stones for your health care provider to see. The stone causing the pain may be as small as a grain of salt. It is very important to use the strainer each and every time you pass your urine. The collection of your stone will allow your health care provider to analyze it and verify that a stone has actually passed. The stone analysis will often identify what you can do to reduce the incidence of recurrences. °· Only take over-the-counter or prescription medicines for pain,  discomfort, or fever as directed by your health care provider. °· Keep all follow-up visits as told by your health care provider. This is important. °· Get follow-up X-rays if required. The absence of pain does not always mean that the stone has passed. It may have only stopped moving. If the urine remains completely obstructed, it can cause loss of kidney function or even complete destruction of the kidney. It is your responsibility to make sure X-rays and follow-ups are completed. Ultrasounds of the kidney can show blockages and the status of the kidney. Ultrasounds are not associated with any radiation and can be performed easily in a matter of minutes. °· Make changes to your daily diet as told by your health care provider. You may be told to: °¨ Limit the amount of salt that you eat. °¨ Eat 5 or more servings of fruits and vegetables each day. °¨ Limit the amount of meat, poultry, fish, and eggs that you eat. °· Collect a 24-hour urine sample as told by your health care provider. You may need to collect another urine sample every 6-12 months. °SEEK MEDICAL CARE IF: °· You experience pain that is progressive and unresponsive to any pain medicine you have been prescribed. °SEEK IMMEDIATE MEDICAL CARE IF:  °· Pain   Pain cannot be controlled with the prescribed medicine. °· You have a fever or shaking chills. °· The severity or intensity of pain increases over 18 hours and is not relieved by pain medicine. °· You develop a new onset of abdominal pain. °· You feel faint or pass out. °· You are unable to urinate. °  °This information is not intended to replace advice given to you by your health care provider. Make sure you discuss any questions you have with your health care provider. °  °Document Released: 03/02/2005 Document Revised: 11/21/2014 Document Reviewed: 08/03/2012 °Elsevier Interactive Patient Education ©2016 [employer] ° °

## 2015-02-21 NOTE — Progress Notes (Signed)
02/21/2015 1:05 PM   SUHANI CHELF Jun 30, 1934 [id number]  Referring provider: Marinda Elk, MD [facility] [place], [place] [address]  Chief Complaint  Patient presents with  . Nephrolithiasis  . Establish Care    HPI: Patient is a 80yo female with a history of kidney stones presenting today as a referral from her PCP for recent onset of left flank pain. CT scan performed yesterday demonstrated a partially obstructing 6 mm left distal ureteral calculus.  No gross hematuria but does report dark urine.  One previous stone episode.  Passed with medical management.   No fevers, nausea, vomiting or urinary symptoms.   Previous urologist Dr. Rogers Blocker. Last visit 2 years ago.  IMPRESSION: 1. 6 mm calculus in the left bony pelvis just above the level of the left acetabulum on scout film consistent with a partially obstructing distal left ureteral calculus. 2. Small left lower pole renal calculus.  PMH: Past Medical History  Diagnosis Date  . Skin cancer   . HLD (hyperlipidemia)   . HTN (hypertension)   . Kidney stone     Surgical History: Past Surgical History  Procedure Laterality Date  . Abdominal hysterectomy    . Cataract extraction  1998, 1999  . Oophorectomy    . Bunionectomy      Home Medications:    Medication List       This list is accurate as of: 02/21/15  1:05 PM.  Always use your most recent med list.               aspirin EC 81 MG tablet  Take 81 mg by mouth.     benazepril 10 MG tablet  Commonly known as:  LOTENSIN  Take 1 tablet by mouth once daily     Calcium-Vitamin D 600-200 MG-UNIT tablet  Take by mouth.     Cetirizine HCl 10 MG Caps  Take by mouth.     simvastatin 20 MG tablet  Commonly known as:  ZOCOR  Take 1 tablet by mouth  nightly     tamsulosin 0.4 MG Caps capsule  Commonly known as:  FLOMAX  Take 1 capsule (0.4 mg total) by mouth daily.     zolpidem 5 MG tablet  Commonly known as:   AMBIEN  Take by mouth.        Allergies:  Allergies  Allergen Reactions  . Phenobarbital Rash    Family History: History reviewed. No pertinent family history.  Social History:  reports that she has never smoked. She does not have any smokeless tobacco history on file. She reports that she does not drink alcohol or use illicit drugs.  ROS: UROLOGY Frequent Urination?: Yes Hard to postpone urination?: No Burning/pain with urination?: No Get up at night to urinate?: Yes Leakage of urine?: No Urine stream starts and stops?: No Trouble starting stream?: No Do you have to strain to urinate?: No Blood in urine?: Yes Urinary tract infection?: No Sexually transmitted disease?: No Injury to kidneys or bladder?: No Painful intercourse?: No Weak stream?: No Currently pregnant?: No Vaginal bleeding?: No Last menstrual period?: n  Gastrointestinal Nausea?: No Vomiting?: No Indigestion/heartburn?: No Diarrhea?: No Constipation?: No  Constitutional Fever: No Night sweats?: No Weight loss?: No Fatigue?: No  Skin Skin rash/lesions?: Yes Itching?: Yes  Eyes Blurred vision?: Yes Double vision?: No  Ears/Nose/Throat Sore throat?: Yes Sinus problems?: Yes  Hematologic/Lymphatic Swollen glands?: No Easy bruising?: Yes  Cardiovascular Leg swelling?: No Chest pain?: No  Respiratory Cough?: Yes Shortness of breath?: No  Endocrine Excessive thirst?: No  Musculoskeletal Back pain?: Yes Joint pain?: No  Neurological Headaches?: No Dizziness?: No  Psychologic Depression?: No Anxiety?: No  Physical Exam: BP 160/73 mmHg  Pulse 63  Resp 16  Ht 5' 5.5" (1.664 m)  Wt 149 lb 1.6 oz (67.631 kg)  BMI 24.43 kg/m2  Constitutional:  Alert and oriented, No acute distress. HEENT: [place] AT, moist mucus membranes.  Trachea midline, no masses. Cardiovascular: No clubbing, cyanosis, or edema. Respiratory: Normal respiratory effort, no increased work of  breathing. GI: Abdomen is soft, nontender, nondistended, no abdominal masses GU: No CVA tenderness.  Skin: No rashes, bruises or suspicious lesions. Lymph: No cervical or inguinal adenopathy. Neurologic: Grossly intact, no focal deficits, moving all 4 extremities. Psychiatric: Normal mood and affect.  Laboratory Data:   Urinalysis    Component Value Date/Time   COLORURINE Yellow 08/02/2012 1643   APPEARANCEUR Hazy 08/02/2012 1643   LABSPEC 1.020 08/02/2012 1643   PHURINE 5.0 08/02/2012 1643   GLUCOSEU Negative 08/02/2012 1643   HGBUR 1+ 08/02/2012 1643   BILIRUBINUR Negative 08/02/2012 1643   KETONESUR Negative 08/02/2012 1643   PROTEINUR Negative 08/02/2012 1643   NITRITE Negative 08/02/2012 1643   LEUKOCYTESUR 3+ 08/02/2012 1643    Pertinent Imaging: CLINICAL DATA: Left flank pain, hematuria for 1 day, history of kidney stones  EXAM: CT ABDOMEN AND PELVIS WITHOUT CONTRAST  TECHNIQUE: Multidetector CT imaging of the abdomen and pelvis was performed following the standard protocol without IV contrast.  COMPARISON: CT abdomen pelvis of 08/31/2012  FINDINGS: The lung bases are clear. The liver is unremarkable in the unenhanced state. No calcified gallstones are seen. The pancreas is normal in size in the pancreatic duct is not dilated. The adrenal glands and spleen are unremarkable. The stomach is decompressed. Only a small left lower pole renal calculus is noted. However, there is moderate left hydronephrosis and hydroureter present. The abdominal aorta is normal in caliber with moderate atherosclerotic change.  The left ureter remains dilated to point of partial obstruction by a 6 mm calculus in the left bony pelvis several cm above the expected left UV junction. In review of the CT scout film, this calculus does appear to be visible by KUB. The lumbar vertebrae are in normal alignment. There is some vacuum disc phenomena noted at the L4-5 level. The  right ureter is normal in caliber. The urinary bladder is decompressed. The uterus is normal in size. No adnexal lesion is seen. No free fluid is noted within the pelvis. There are multiple rectosigmoid colon diverticula present with diverticulosis. No diverticulitis is seen. Diverticula also are noted throughout the more proximal colon as well. The terminal ileum is unremarkable. The appendix is not visualized.  IMPRESSION: 1. 6 mm calculus in the left bony pelvis just above the level of the left acetabulum on scout film consistent with a partially obstructing distal left ureteral calculus. 2. Small left lower pole renal calculus. 3. Multiple rectosigmoid colon diverticula with diverticulosis. Electronically Signed  By: Ivar Drape M.D.  On: 02/20/2015 12:12  Assessment & Plan:    1. Nephrolithiasis-  Patient currently asymptomatic except for resolving dark urine. Will manage conservatively at this time. I sent him prescription for Flomax. Patient encouraged to push fluids and strain her urine. She was advised to seek immediate medical attention for fevers, severe flank pain or inability to keep food or fluids down.    - Urinalysis, Complete - CULTURE, URINE COMPREHENSIVE  Return in about 2 weeks (around 03/07/2015) for recheck kidney stone.  These notes generated with voice recognition software. I apologize for typographical errors.  Herbert Moors, [facility] [address], [place], [place] [address] [phone]

## 2015-02-23 LAB — CULTURE, URINE COMPREHENSIVE

## 2015-03-07 ENCOUNTER — Ambulatory Visit: Payer: Medicare Other | Admitting: Obstetrics and Gynecology

## 2015-03-07 ENCOUNTER — Other Ambulatory Visit: Payer: Self-pay | Admitting: Physician Assistant

## 2015-03-07 DIAGNOSIS — Z1231 Encounter for screening mammogram for malignant neoplasm of breast: Secondary | ICD-10-CM

## 2015-04-25 ENCOUNTER — Ambulatory Visit
Admission: RE | Admit: 2015-04-25 | Discharge: 2015-04-25 | Disposition: A | Discharge status: Home / Self Care | Payer: Medicare Other | Source: Ambulatory Visit | Attending: Physician Assistant | Admitting: Physician Assistant

## 2015-04-25 ENCOUNTER — Other Ambulatory Visit: Payer: Self-pay | Admitting: Physician Assistant

## 2015-04-25 DIAGNOSIS — Z1231 Encounter for screening mammogram for malignant neoplasm of breast: Secondary | ICD-10-CM | POA: Diagnosis not present

## 2016-03-19 ENCOUNTER — Other Ambulatory Visit: Payer: Self-pay | Admitting: Physician Assistant

## 2016-03-19 DIAGNOSIS — Z1231 Encounter for screening mammogram for malignant neoplasm of breast: Secondary | ICD-10-CM

## 2016-04-27 ENCOUNTER — Ambulatory Visit
Admission: RE | Admit: 2016-04-27 | Discharge: 2016-04-27 | Disposition: A | Discharge status: Home / Self Care | Payer: Medicare Other | Source: Ambulatory Visit | Attending: Physician Assistant | Admitting: Physician Assistant

## 2016-04-27 DIAGNOSIS — Z1231 Encounter for screening mammogram for malignant neoplasm of breast: Secondary | ICD-10-CM | POA: Insufficient documentation

## 2017-03-22 ENCOUNTER — Other Ambulatory Visit: Payer: Self-pay | Admitting: Physician Assistant

## 2017-03-22 DIAGNOSIS — Z1231 Encounter for screening mammogram for malignant neoplasm of breast: Secondary | ICD-10-CM

## 2017-04-29 ENCOUNTER — Ambulatory Visit
Admission: RE | Admit: 2017-04-29 | Discharge: 2017-04-29 | Disposition: A | Discharge status: Home / Self Care | Payer: Medicare Other | Source: Ambulatory Visit | Attending: Physician Assistant | Admitting: Physician Assistant

## 2017-04-29 DIAGNOSIS — Z1231 Encounter for screening mammogram for malignant neoplasm of breast: Secondary | ICD-10-CM | POA: Diagnosis present

## 2018-03-23 ENCOUNTER — Other Ambulatory Visit: Payer: Self-pay | Admitting: Physician Assistant

## 2018-03-23 DIAGNOSIS — Z1231 Encounter for screening mammogram for malignant neoplasm of breast: Secondary | ICD-10-CM

## 2018-05-02 ENCOUNTER — Ambulatory Visit
Admission: RE | Admit: 2018-05-02 | Discharge: 2018-05-02 | Disposition: A | Discharge status: Home / Self Care | Payer: Medicare Other | Source: Ambulatory Visit | Attending: Physician Assistant | Admitting: Physician Assistant

## 2018-05-02 DIAGNOSIS — Z1231 Encounter for screening mammogram for malignant neoplasm of breast: Secondary | ICD-10-CM | POA: Insufficient documentation

## 2019-04-11 ENCOUNTER — Other Ambulatory Visit: Payer: Self-pay | Admitting: Physician Assistant

## 2019-04-11 DIAGNOSIS — Z1231 Encounter for screening mammogram for malignant neoplasm of breast: Secondary | ICD-10-CM

## 2019-05-10 ENCOUNTER — Ambulatory Visit
Admission: RE | Admit: 2019-05-10 | Discharge: 2019-05-10 | Disposition: A | Discharge status: Home / Self Care | Payer: Medicare Other | Source: Ambulatory Visit | Attending: Physician Assistant | Admitting: Physician Assistant

## 2019-05-10 DIAGNOSIS — Z1231 Encounter for screening mammogram for malignant neoplasm of breast: Secondary | ICD-10-CM | POA: Diagnosis not present

## 2020-02-29 ENCOUNTER — Other Ambulatory Visit: Payer: Self-pay | Admitting: Physician Assistant

## 2020-02-29 DIAGNOSIS — Z1231 Encounter for screening mammogram for malignant neoplasm of breast: Secondary | ICD-10-CM

## 2020-05-10 ENCOUNTER — Other Ambulatory Visit: Payer: Self-pay

## 2020-05-10 ENCOUNTER — Ambulatory Visit
Admission: RE | Admit: 2020-05-10 | Discharge: 2020-05-10 | Disposition: A | Discharge status: Home / Self Care | Payer: Medicare Other | Source: Ambulatory Visit | Attending: Physician Assistant | Admitting: Physician Assistant

## 2020-05-10 DIAGNOSIS — Z1231 Encounter for screening mammogram for malignant neoplasm of breast: Secondary | ICD-10-CM | POA: Insufficient documentation

## 2020-06-12 ENCOUNTER — Other Ambulatory Visit: Payer: Self-pay

## 2020-06-12 ENCOUNTER — Ambulatory Visit: Payer: Medicare Other | Admitting: Dermatology

## 2020-06-12 ENCOUNTER — Encounter: Payer: Self-pay | Admitting: Dermatology

## 2020-06-12 DIAGNOSIS — Z1283 Encounter for screening for malignant neoplasm of skin: Secondary | ICD-10-CM | POA: Diagnosis not present

## 2020-06-12 DIAGNOSIS — L821 Other seborrheic keratosis: Secondary | ICD-10-CM

## 2020-06-12 DIAGNOSIS — L578 Other skin changes due to chronic exposure to nonionizing radiation: Secondary | ICD-10-CM | POA: Diagnosis not present

## 2020-06-12 DIAGNOSIS — L570 Actinic keratosis: Secondary | ICD-10-CM | POA: Diagnosis not present

## 2020-06-12 DIAGNOSIS — D1800 Hemangioma unspecified site: Secondary | ICD-10-CM

## 2020-06-12 DIAGNOSIS — Z85828 Personal history of other malignant neoplasm of skin: Secondary | ICD-10-CM | POA: Diagnosis not present

## 2020-06-12 DIAGNOSIS — D692 Other nonthrombocytopenic purpura: Secondary | ICD-10-CM | POA: Diagnosis not present

## 2020-06-12 DIAGNOSIS — L820 Inflamed seborrheic keratosis: Secondary | ICD-10-CM | POA: Diagnosis not present

## 2020-06-12 DIAGNOSIS — L814 Other melanin hyperpigmentation: Secondary | ICD-10-CM

## 2020-06-12 DIAGNOSIS — D229 Melanocytic nevi, unspecified: Secondary | ICD-10-CM

## 2020-06-12 NOTE — Progress Notes (Signed)
New Patient Visit  Subjective  Ann Hebert is a 86 y.o. female who presents for the following: Annual Exam (Total body exam today. Hx of multiple BCC and SCC. Most recent was the [facility] under left arm approx 2 years ago. No hx of dysplastic nevi. Pt states that she has scaly areas all over both arms and spots all over body that she would like checked today.). Patient here for full body skin exam and skin cancer screening.  Objective  Well appearing patient in no apparent distress; mood and affect are within normal limits.  A full examination was performed including scalp, head, eyes, ears, nose, lips, neck, chest, axillae, abdomen, back, buttocks, bilateral upper extremities, bilateral lower extremities, hands, feet, fingers, toes, fingernails, and toenails. All findings within normal limits unless otherwise noted below.  Objective  arms and face x 25 (25): Erythematous thin papules/macules with gritty scale.   Objective  trunk and chest x 16, left leg x 2 (18): Erythematous keratotic or waxy stuck-on papule or plaque.   Assessment & Plan  AK (actinic keratosis) (25) arms and face x 25 Prior to procedure, discussed risks of blister formation, small wound, skin dyspigmentation, or rare scar following cryotherapy.   Destruction of lesion - arms and face x 25 Complexity: simple   Destruction method: cryotherapy   Informed consent: discussed and consent obtained   Timeout:  patient name, date of birth, surgical site, and procedure verified Lesion destroyed using liquid nitrogen: Yes   Region frozen until ice ball extended beyond lesion: Yes   Outcome: patient tolerated procedure well with no complications   Post-procedure details: wound care instructions given    Inflamed seborrheic keratosis (18) trunk and chest x 16, left leg x 2 Prior to procedure, discussed risks of blister formation, small wound, skin dyspigmentation, or rare scar following cryotherapy.   Destruction of  lesion - trunk and chest x 16, left leg x 2 Complexity: simple   Destruction method: cryotherapy   Informed consent: discussed and consent obtained   Timeout:  patient name, date of birth, surgical site, and procedure verified Lesion destroyed using liquid nitrogen: Yes   Region frozen until ice ball extended beyond lesion: Yes   Outcome: patient tolerated procedure well with no complications   Post-procedure details: wound care instructions given     Lentigines - Scattered tan macules - Due to sun exposure - Benign-appering, observe - Recommend daily broad spectrum sunscreen SPF 30+ to sun-exposed areas, reapply every 2 hours as needed. - Call for any changes  Seborrheic Keratoses - Stuck-on, waxy, tan-brown papules and/or plaques  - Benign-appearing - Discussed benign etiology and prognosis. - Observe - Call for any changes  Melanocytic Nevi - Tan-brown and/or pink-flesh-colored symmetric macules and papules - Benign appearing on exam today - Observation - Call clinic for new or changing moles - Recommend daily use of broad spectrum spf 30+ sunscreen to sun-exposed areas.   Hemangiomas - Red papules - Discussed benign nature - Observe - Call for any changes  Purpura - Chronic; persistent and recurrent.  Treatable, but not curable. - Violaceous macules and patches - Benign - Related to trauma, age, sun damage and/or use of blood thinners, chronic use of topical and/or oral steroids - Observe - Can use OTC arnica containing moisturizer such as Dermend Bruise Formula if desired - Call for worsening or other concerns  Actinic Damage - Severe, confluent actinic changes with pre-cancerous actinic keratoses  - Severe, chronic, not at goal, secondary  to cumulative UV radiation exposure over time - diffuse scaly erythematous macules and papules with underlying dyspigmentation - Discussed Prescription "Field Treatment" for Severe, Chronic Confluent Actinic Changes with  Pre-Cancerous Actinic Keratoses Field treatment involves treatment of an entire area of skin that has confluent Actinic Changes (Sun/ Ultraviolet light damage) and PreCancerous Actinic Keratoses by method of PhotoDynamic Therapy (PDT) and/or prescription Topical Chemotherapy agents such as 5-fluorouracil, 5-fluorouracil/calcipotriene, and/or imiquimod.  The purpose is to decrease the number of clinically evident and subclinical PreCancerous lesions to prevent progression to development of skin cancer by chemically destroying early precancer changes that may or may not be visible.  It has been shown to reduce the risk of developing skin cancer in the treated area. As a result of treatment, redness, scaling, crusting, and open sores may occur during treatment course. One or more than one of these methods may be used and may have to be used several times to control, suppress and eliminate the PreCancerous changes. Discussed treatment course, expected reaction, and possible side effects. - Recommend daily broad spectrum sunscreen SPF 30+ to sun-exposed areas, reapply every 2 hours as needed.  - Staying in the shade or wearing long sleeves, sun glasses (UVA+UVB protection) and wide brim hats (4-inch brim around the entire circumference of the hat) are also recommended. - Call for new or changing lesions. -We will consider field treatment on follow-up.  Skin cancer screening performed today.  History of Basal Cell Carcinoma of the Skin - No evidence of recurrence today - Recommend regular full body skin exams - Recommend daily broad spectrum sunscreen SPF 30+ to sun-exposed areas, reapply every 2 hours as needed.  - Call if any new or changing lesions are noted between office visits  History of Squamous Cell Carcinoma of the Skin - No evidence of recurrence today - No lymphadenopathy - Recommend regular full body skin exams - Recommend daily broad spectrum sunscreen SPF 30+ to sun-exposed areas, reapply  every 2 hours as needed.  - Call if any new or changing lesions are noted between office visits  Return in about 4 months (around 10/12/2020) for 2-4 mo AK/ISK f/u.   IHarriett Sine, CMA, am acting as scribe for Sarina Ser, MD.  Documentation: I have reviewed the above documentation for accuracy and completeness, and I agree with the above.  Sarina Ser, MD

## 2020-06-12 NOTE — Patient Instructions (Addendum)

## 2020-06-13 ENCOUNTER — Encounter: Payer: Self-pay | Admitting: Dermatology

## 2020-10-07 ENCOUNTER — Ambulatory Visit: Payer: Medicare Other | Admitting: Dermatology

## 2020-11-20 ENCOUNTER — Other Ambulatory Visit: Payer: Self-pay

## 2020-11-20 ENCOUNTER — Ambulatory Visit: Payer: Medicare Other | Admitting: Dermatology

## 2020-11-20 DIAGNOSIS — L570 Actinic keratosis: Secondary | ICD-10-CM | POA: Diagnosis not present

## 2020-11-20 DIAGNOSIS — L578 Other skin changes due to chronic exposure to nonionizing radiation: Secondary | ICD-10-CM | POA: Diagnosis not present

## 2020-11-20 DIAGNOSIS — L820 Inflamed seborrheic keratosis: Secondary | ICD-10-CM

## 2020-11-20 DIAGNOSIS — D485 Neoplasm of uncertain behavior of skin: Secondary | ICD-10-CM

## 2020-11-20 DIAGNOSIS — C4442 Squamous cell carcinoma of skin of scalp and neck: Secondary | ICD-10-CM | POA: Diagnosis not present

## 2020-11-20 DIAGNOSIS — C4492 Squamous cell carcinoma of skin, unspecified: Secondary | ICD-10-CM

## 2020-11-20 HISTORY — DX: Squamous cell carcinoma of skin, unspecified: C44.92

## 2020-11-20 NOTE — Progress Notes (Signed)
Follow-Up Visit   Subjective  Ann Hebert is a 86 y.o. female who presents for the following: Other (Spots on right neck, right arm and right leg x few months that have gotten sore).  The following portions of the chart were reviewed this encounter and updated as appropriate:   Allergies  Meds  Problems  Med Hx  Surg Hx  Fam Hx     Review of Systems:  No other skin or systemic complaints except as noted in HPI or Assessment and Plan.  Objective  Well appearing patient in no apparent distress; mood and affect are within normal limits.  A focused examination was performed including face, neck, arms, hands. Relevant physical exam findings are noted in the Assessment and Plan.  Right lat neck 1.1 cm hyperkeratotic papule  Arms - Total 17 (17) Erythematous keratotic or waxy stuck-on papule or plaque.   Hands x 16, right knee x 1 (17) Erythematous thin papules/macules with gritty scale.    Assessment & Plan   Actinic Damage - chronic, secondary to cumulative UV radiation exposure/sun exposure over time - diffuse scaly erythematous macules with underlying dyspigmentation - Recommend daily broad spectrum sunscreen SPF 30+ to sun-exposed areas, reapply every 2 hours as needed.  - Recommend staying in the shade or wearing long sleeves, sun glasses (UVA+UVB protection) and wide brim hats (4-inch brim around the entire circumference of the hat). - Call for new or changing lesions.  Neoplasm of uncertain behavior of skin Right lat neck  Epidermal / dermal shaving  Lesion diameter (cm):  1.1 Informed consent: discussed and consent obtained   Timeout: patient name, date of birth, surgical site, and procedure verified   Procedure prep:  Patient was prepped and draped in usual sterile fashion Prep type:  Isopropyl alcohol Anesthesia: the lesion was anesthetized in a standard fashion   Anesthetic:  1% lidocaine w/ epinephrine 1-100,000 buffered w/ 8.4% NaHCO3 Instrument  used: flexible razor blade   Hemostasis achieved with: pressure, aluminum chloride and electrodesiccation   Outcome: patient tolerated procedure well   Post-procedure details: sterile dressing applied and wound care instructions given   Dressing type: bandage and petrolatum    Destruction of lesion Complexity: extensive   Destruction method: electrodesiccation and curettage   Informed consent: discussed and consent obtained   Timeout:  patient name, date of birth, surgical site, and procedure verified Procedure prep:  Patient was prepped and draped in usual sterile fashion Prep type:  Isopropyl alcohol Anesthesia: the lesion was anesthetized in a standard fashion   Anesthetic:  1% lidocaine w/ epinephrine 1-100,000 buffered w/ 8.4% NaHCO3 Curettage performed in three different directions: Yes   Electrodesiccation performed over the curetted area: Yes   Lesion length (cm):  1.1 Lesion width (cm):  1.1 Margin per side (cm):  0.2 Final wound size (cm):  1.5 Hemostasis achieved with:  pressure and aluminum chloride Outcome: patient tolerated procedure well with no complications   Post-procedure details: sterile dressing applied and wound care instructions given   Dressing type: bandage and petrolatum    Specimen 1 - Surgical pathology Differential Diagnosis: SCC vs other  Check Margins: No EDC today  Inflamed seborrheic keratosis Arms - Total 17  Destruction of lesion - Arms - Total 17 Complexity: simple   Destruction method: cryotherapy   Informed consent: discussed and consent obtained   Timeout:  patient name, date of birth, surgical site, and procedure verified Lesion destroyed using liquid nitrogen: Yes   Region frozen until ice  ball extended beyond lesion: Yes   Outcome: patient tolerated procedure well with no complications   Post-procedure details: wound care instructions given    AK (actinic keratosis) (17) Hands x 16, right knee x 1  Destruction of lesion - Hands  x 16, right knee x 1 Complexity: simple   Destruction method: cryotherapy   Informed consent: discussed and consent obtained   Timeout:  patient name, date of birth, surgical site, and procedure verified Lesion destroyed using liquid nitrogen: Yes   Region frozen until ice ball extended beyond lesion: Yes   Outcome: patient tolerated procedure well with no complications   Post-procedure details: wound care instructions given    SCC (squamous cell carcinoma), scalp/neck  Return in about 3 months (around 02/19/2021) for Follow up.  I, Ashok Cordia, CMA, am acting as scribe for Sarina Ser, MD . Documentation: I have reviewed the above documentation for accuracy and completeness, and I agree with the above.  Sarina Ser, MD

## 2020-11-20 NOTE — Patient Instructions (Signed)
Wound Care Instructions  Cleanse wound gently with soap and water once a day then pat dry with clean gauze. Apply a thing coat of Petrolatum (petroleum jelly, "Vaseline") over the wound (unless you have an allergy to this). We recommend that you use a new, sterile tube of Vaseline. Do not pick or remove scabs. Do not remove the yellow or white "healing tissue" from the base of the wound.  Cover the wound with fresh, clean, nonstick gauze and secure with paper tape. You may use Band-Aids in place of gauze and tape if the would is small enough, but would recommend trimming much of the tape off as there is often too much. Sometimes Band-Aids can irritate the skin.  You should call the office for your biopsy report after 1 week if you have not already been contacted.  If you experience any problems, such as abnormal amounts of bleeding, swelling, significant bruising, significant pain, or evidence of infection, please call the office immediately.  FOR ADULT SURGERY PATIENTS: If you need something for pain relief you may take 1 extra strength Tylenol (acetaminophen) AND 2 Ibuprofen (200mg each) together every 4 hours as needed for pain. (do not take these if you are allergic to them or if you have a reason you should not take them.) Typically, you may only need pain medication for 1 to 3 days.    Cryotherapy Aftercare  Wash gently with soap and water everyday.   Apply Vaseline and Band-Aid daily until healed.    If you have any questions or concerns for your doctor, please call our main line at [phone] and press option 4 to reach your doctor's medical assistant. If no one answers, please leave a voicemail as directed and we will return your call as soon as possible. Messages left after 4 pm will be answered the following business day.   You may also send us a message via MyChart. We typically respond to MyChart messages within 1-2 business days.  For prescription refills, please ask your  pharmacy to contact our office. Our fax number is [phone].  If you have an urgent issue when the clinic is closed that cannot wait until the next business day, you can page your doctor at the number below.    Please note that while we do our best to be available for urgent issues outside of office hours, we are not available 24/7.   If you have an urgent issue and are unable to reach us, you may choose to seek medical care at your doctor's office, retail clinic, urgent care center, or emergency room.  If you have a medical emergency, please immediately call 911 or go to the emergency department.  Pager Numbers  - Dr. Kowalski: [phone]  - Dr. Moye: [phone]  - Dr. Stewart: [phone]  In the event of inclement weather, please call our main line at [phone] for an update on the status of any delays or closures.  Dermatology Medication Tips: Please keep the boxes that topical medications come in in order to help keep track of the instructions about where and how to use these. Pharmacies typically print the medication instructions only on the boxes and not directly on the medication tubes.   If your medication is too expensive, please contact our office at [phone] option 4 or send us a message through MyChart.   We are unable to tell what your co-pay for medications will be in advance as this is different depending on your insurance coverage. However,   we may be able to find a substitute medication at lower cost or fill out paperwork to get insurance to cover a needed medication.   If a prior authorization is required to get your medication covered by your insurance company, please allow us 1-2 business days to complete this process.  Drug prices often vary depending on where the prescription is filled and some pharmacies may offer cheaper prices.  The website www.goodrx.com contains coupons for medications through different pharmacies. The prices here do not  account for what the cost may be with help from insurance (it may be cheaper with your insurance), but the website can give you the price if you did not use any insurance.  - You can print the associated coupon and take it with your prescription to the pharmacy.  - You may also stop by our office during regular business hours and pick up a GoodRx coupon card.  - If you need your prescription sent electronically to a different pharmacy, notify our office through [facility] MyChart or by phone at [phone] option 4.  

## 2020-11-21 ENCOUNTER — Encounter: Payer: Self-pay | Admitting: Dermatology

## 2020-11-25 ENCOUNTER — Telehealth: Payer: Self-pay

## 2020-11-25 NOTE — Telephone Encounter (Signed)
-----   Message from Ralene Bathe, MD sent at 11/21/2020  5:28 PM EDT ----- Diagnosis Skin , right lat neck WELL DIFFERENTIATED SQUAMOUS CELL CARCINOMA, BASE INVOLVED  Cancer - SCC Already treated Recheck next visit

## 2020-11-25 NOTE — Telephone Encounter (Signed)
Advised patient of results/hd  

## 2021-02-26 ENCOUNTER — Ambulatory Visit: Payer: Medicare Other | Admitting: Dermatology

## 2021-02-26 ENCOUNTER — Other Ambulatory Visit: Payer: Self-pay

## 2021-02-26 DIAGNOSIS — L578 Other skin changes due to chronic exposure to nonionizing radiation: Secondary | ICD-10-CM | POA: Diagnosis not present

## 2021-02-26 DIAGNOSIS — Z85828 Personal history of other malignant neoplasm of skin: Secondary | ICD-10-CM

## 2021-02-26 DIAGNOSIS — L570 Actinic keratosis: Secondary | ICD-10-CM | POA: Diagnosis not present

## 2021-02-26 DIAGNOSIS — L820 Inflamed seborrheic keratosis: Secondary | ICD-10-CM | POA: Diagnosis not present

## 2021-02-26 MED ORDER — FLUOROURACIL 5 % EX CREA: TOPICAL_CREAM | Freq: Two times a day (BID) | CUTANEOUS | 1 refills | Status: DC

## 2021-02-26 NOTE — Patient Instructions (Addendum)
If You Need Anything After Your Visit ° °If you have any questions or concerns for your doctor, please call our main line at [phone] and press option 4 to reach your doctor's medical assistant. If no one answers, please leave a voicemail as directed and we will return your call as soon as possible. Messages left after 4 pm will be answered the following business day.  ° °You may also send us a message via MyChart. We typically respond to MyChart messages within 1-2 business days. ° °For prescription refills, please ask your pharmacy to contact our office. Our fax number is [phone]. ° °If you have an urgent issue when the clinic is closed that cannot wait until the next business day, you can page your doctor at the number below.   ° °Please note that while we do our best to be available for urgent issues outside of office hours, we are not available 24/7.  ° °If you have an urgent issue and are unable to reach us, you may choose to seek medical care at your doctor's office, retail clinic, urgent care center, or emergency room. ° °If you have a medical emergency, please immediately call 911 or go to the emergency department. ° °Pager Numbers ° °- Dr. Kowalski: [phone] ° °- Dr. Moye: [phone] ° °- Dr. Stewart: [phone] ° °In the event of inclement weather, please call our main line at [phone] for an update on the status of any delays or closures. ° °Dermatology Medication Tips: °Please keep the boxes that topical medications come in in order to help keep track of the instructions about where and how to use these. Pharmacies typically print the medication instructions only on the boxes and not directly on the medication tubes.  ° °If your medication is too expensive, please contact our office at [phone] option 4 or send us a message through MyChart.  ° °We are unable to tell what your co-pay for medications will be in advance as this is different depending on your insurance coverage.  However, we may be able to find a substitute medication at lower cost or fill out paperwork to get insurance to cover a needed medication.  ° °If a prior authorization is required to get your medication covered by your insurance company, please allow us 1-2 business days to complete this process. ° °Drug prices often vary depending on where the prescription is filled and some pharmacies may offer cheaper prices. ° °The website www.goodrx.com contains coupons for medications through different pharmacies. The prices here do not account for what the cost may be with help from insurance (it may be cheaper with your insurance), but the website can give you the price if you did not use any insurance.  °- You can print the associated coupon and take it with your prescription to the pharmacy.  °- You may also stop by our office during regular business hours and pick up a GoodRx coupon card.  °- If you need your prescription sent electronically to a different pharmacy, notify our office through [facility] MyChart or by phone at [phone] option 4. ° ° ° ° °Si Usted Necesita Algo Después de Su Visita ° °También puede enviarnos un mensaje a través de MyChart. Por lo general respondemos a los mensajes de MyChart en el transcurso de 1 a 2 días hábiles. ° °Para renovar recetas, por favor pida a su farmacia que se ponga en contacto con nuestra oficina. Nuestro número de fax es el [phone]. ° °Si tiene   un asunto urgente cuando la clnica est cerrada y que no puede esperar hasta el siguiente da hbil, puede llamar/localizar a su doctor(a) al nmero que aparece a continuacin.   Por favor, tenga en cuenta que aunque hacemos todo lo posible para estar disponibles para asuntos urgentes fuera del horario de [place], no estamos disponibles las 24 horas del da, los 7 das de la [place].   Si tiene un problema urgente y no puede comunicarse con nosotros, puede optar por buscar atencin mdica  en el consultorio de su  doctor(a), en una clnica privada, en un centro de atencin urgente o en una sala de emergencias.  Si tiene Engineering geologist, por favor llame inmediatamente al 911 o vaya a la sala de emergencias.  Nmeros de bper  - Dr. Nehemiah Massed: [phone]  - Dra. Moye: [phone]  - Dra. Nicole Kindred: [phone]  En caso de inclemencias del [place], por favor llame a Johnsie Kindred principal al [phone] para una actualizacin sobre el [place] de cualquier retraso o cierre.  Consejos para la medicacin en dermatologa: Por favor, guarde las cajas en las que vienen los medicamentos de uso tpico para ayudarle a seguir las instrucciones sobre dnde y cmo usarlos. Las farmacias generalmente imprimen las instrucciones del medicamento slo en las cajas y no directamente en los tubos del [place].   Si su medicamento es muy caro, por favor, pngase en contacto con Zigmund Daniel llamando al [phone] y presione la opcin 4 o envenos un mensaje a travs de Pharmacist, community.   No podemos decirle cul ser su copago por los medicamentos por adelantado ya que esto es diferente dependiendo de la cobertura de su seguro. Sin embargo, es posible que podamos encontrar un medicamento sustituto a Electrical engineer un formulario para que el seguro cubra el medicamento que se considera necesario.   Si se requiere una autorizacin previa para que su compaa de seguros [place] su medicamento, por favor permtanos de 1 a 2 das hbiles para completar este proceso.  Los precios de los medicamentos varan con frecuencia dependiendo del Environmental consultant de dnde se surte la receta y alguna farmacias pueden ofrecer precios ms baratos.  El sitio web www.goodrx.com tiene cupones para medicamentos de Airline pilot. Los precios aqu no tienen en cuenta lo que podra costar con la ayuda del seguro (puede ser ms barato con su seguro), pero el sitio web puede darle el precio si no utiliz Research scientist (physical sciences).  - Puede imprimir el cupn  correspondiente y llevarlo con su receta a la farmacia.  - Tambin puede pasar por nuestra oficina durante el horario de atencin regular y Charity fundraiser una tarjeta de cupones de GoodRx.  - Si necesita que su receta se enve electrnicamente a una farmacia diferente, informe a nuestra oficina a travs de MyChart de [place] o por telfono llamando al [phone] y presione la opcin 4.   In January 2023 start 5-fluorouracil/calcipotriene cream twice a day for 7 days to affected areas including forehead, and bil temples. In February use on cheeks bid for 7 days.  Prescription sent to [facility]. Patient provided with contact information for pharmacy and advised the pharmacy will mail the prescription to their home. Patient provided with handout reviewing treatment course and side effects and advised to call or message [place] on MyChart with any concerns.

## 2021-02-26 NOTE — Progress Notes (Signed)
Follow-Up Visit   Subjective  Ann Hebert is a 86 y.o. female who presents for the following: Actinic Keratosis (Hands, 33m f/u), Hx of SCC (R lat neck, bx and EDC 11/20/20, recheck today), scaly spots (Face, months, used Efudex yrs ago), and check spots (L arm, chest, irritating). The patient has spots, moles and lesions to be evaluated, some may be new or changing and the patient has concerns that these could be cancer.  The following portions of the chart were reviewed this encounter and updated as appropriate:   Tobacco   Allergies   Meds   Problems   Med Hx   Surg Hx   Fam Hx      Review of Systems:  No other skin or systemic complaints except as noted in HPI or Assessment and Plan.  Objective  Well appearing patient in no apparent distress; mood and affect are within normal limits.  A focused examination was performed including face, neck, chest, arms, hands. Relevant physical exam findings are noted in the Assessment and Plan.  R lat neck Pink bx site  chest, face, hands, back x 24 (24) Pink scaly macules chest Confluent scaly macules face   chest, upper back, neck, left arm, R lower leg x 20 (20) Erythematous keratotic or waxy stuck-on papule or plaque.    Assessment & Plan   Actinic Damage - Severe, confluent actinic changes with pre-cancerous actinic keratoses  - Severe, chronic, not at goal, secondary to cumulative UV radiation exposure over time - diffuse scaly erythematous macules and papules with underlying dyspigmentation - Discussed Prescription "Field Treatment" for Severe, Chronic Confluent Actinic Changes with Pre-Cancerous Actinic Keratoses Field treatment involves treatment of an entire area of skin that has confluent Actinic Changes (Sun/ Ultraviolet light damage) and PreCancerous Actinic Keratoses by method of PhotoDynamic Therapy (PDT) and/or prescription Topical Chemotherapy agents such as 5-fluorouracil, 5-fluorouracil/calcipotriene, and/or  imiquimod.  The purpose is to decrease the number of clinically evident and subclinical PreCancerous lesions to prevent progression to development of skin cancer by chemically destroying early precancer changes that may or may not be visible.  It has been shown to reduce the risk of developing skin cancer in the treated area. As a result of treatment, redness, scaling, crusting, and open sores may occur during treatment course. One or more than one of these methods may be used and may have to be used several times to control, suppress and eliminate the PreCancerous changes. Discussed treatment course, expected reaction, and possible side effects. - Recommend daily broad spectrum sunscreen SPF 30+ to sun-exposed areas, reapply every 2 hours as needed.  - Staying in the shade or wearing long sleeves, sun glasses (UVA+UVB protection) and wide brim hats (4-inch brim around the entire circumference of the hat) are also recommended. - Call for new or changing lesions.  Start 5-fluorouracil/calcipotriene cream twice a day for 7 days to affected areas including forehead, and bil temples. In February use on cheeks bid for 7 days.  Prescription sent to [facility]. Patient provided with contact information for pharmacy and advised the pharmacy will mail the prescription to their home. Patient provided with handout reviewing treatment course and side effects and advised to call or message [place] on MyChart with any concerns.   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 Observe for recurrence. Call clinic for new or changing lesions.  Recommend regular skin exams, daily broad-spectrum spf 30+ sunscreen use, and photoprotection.    AK (  actinic keratosis) (24) chest, face, hands, back x 24  Destruction of lesion - chest, face, hands, back x 24 Complexity: simple   Destruction method: cryotherapy   Informed consent: discussed and consent obtained   Timeout:  patient name, date of birth,  surgical site, and procedure verified Lesion destroyed using liquid nitrogen: Yes   Region frozen until ice ball extended beyond lesion: Yes   Outcome: patient tolerated procedure well with no complications   Post-procedure details: wound care instructions given    fluorouracil (EFUDEX) 5 % cream - chest, face, hands, back x 24 Apply topically 2 (two) times daily. Bid to forehead and temples for 7 days, then in February treat cheeks bid for 7 days  Inflamed seborrheic keratosis chest, upper back, neck, left arm, R lower leg x 20  Destruction of lesion - chest, upper back, neck, left arm, R lower leg x 20 Complexity: simple   Destruction method: cryotherapy   Informed consent: discussed and consent obtained   Timeout:  patient name, date of birth, surgical site, and procedure verified Lesion destroyed using liquid nitrogen: Yes   Region frozen until ice ball extended beyond lesion: Yes   Outcome: patient tolerated procedure well with no complications   Post-procedure details: wound care instructions given     Return in about 3 months (around 05/27/2021) for AK f/u, ISK f/u.  I, Othelia Pulling, RMA, am acting as scribe for Sarina Ser, MD . Documentation: I have reviewed the above documentation for accuracy and completeness, and I agree with the above.  Sarina Ser, MD

## 2021-03-02 ENCOUNTER — Encounter: Payer: Self-pay | Admitting: Dermatology

## 2021-04-10 ENCOUNTER — Other Ambulatory Visit: Payer: Self-pay | Admitting: Physician Assistant

## 2021-04-10 DIAGNOSIS — Z1231 Encounter for screening mammogram for malignant neoplasm of breast: Secondary | ICD-10-CM

## 2021-05-15 ENCOUNTER — Other Ambulatory Visit: Payer: Self-pay

## 2021-05-15 ENCOUNTER — Ambulatory Visit
Admission: RE | Admit: 2021-05-15 | Discharge: 2021-05-15 | Disposition: A | Discharge status: Home / Self Care | Payer: Medicare Other | Source: Ambulatory Visit | Attending: Physician Assistant | Admitting: Physician Assistant

## 2021-05-15 DIAGNOSIS — Z1231 Encounter for screening mammogram for malignant neoplasm of breast: Secondary | ICD-10-CM | POA: Insufficient documentation

## 2021-05-16 ENCOUNTER — Other Ambulatory Visit: Payer: Self-pay | Admitting: Physician Assistant

## 2021-05-16 ENCOUNTER — Other Ambulatory Visit: Payer: Self-pay | Admitting: Body Imaging

## 2021-05-16 DIAGNOSIS — N6489 Other specified disorders of breast: Secondary | ICD-10-CM

## 2021-05-16 DIAGNOSIS — R928 Other abnormal and inconclusive findings on diagnostic imaging of breast: Secondary | ICD-10-CM

## 2021-05-20 ENCOUNTER — Other Ambulatory Visit: Payer: Self-pay | Admitting: Family Medicine

## 2021-05-20 DIAGNOSIS — R928 Other abnormal and inconclusive findings on diagnostic imaging of breast: Secondary | ICD-10-CM

## 2021-05-20 DIAGNOSIS — N6489 Other specified disorders of breast: Secondary | ICD-10-CM

## 2021-06-02 ENCOUNTER — Ambulatory Visit: Payer: Medicare Other | Admitting: Dermatology

## 2021-06-06 ENCOUNTER — Other Ambulatory Visit: Payer: Medicare Other

## 2021-06-09 ENCOUNTER — Ambulatory Visit
Admission: RE | Admit: 2021-06-09 | Discharge: 2021-06-09 | Disposition: A | Discharge status: Home / Self Care | Payer: Medicare Other | Source: Ambulatory Visit | Attending: Family Medicine | Admitting: Family Medicine

## 2021-06-09 ENCOUNTER — Other Ambulatory Visit: Payer: Self-pay

## 2021-06-09 DIAGNOSIS — N6489 Other specified disorders of breast: Secondary | ICD-10-CM | POA: Diagnosis present

## 2021-06-09 DIAGNOSIS — R928 Other abnormal and inconclusive findings on diagnostic imaging of breast: Secondary | ICD-10-CM

## 2021-07-07 ENCOUNTER — Ambulatory Visit: Payer: Medicare Other | Admitting: Dermatology

## 2022-01-09 ENCOUNTER — Other Ambulatory Visit: Payer: Self-pay | Admitting: Physician Assistant

## 2022-01-09 DIAGNOSIS — Z1231 Encounter for screening mammogram for malignant neoplasm of breast: Secondary | ICD-10-CM

## 2022-01-19 ENCOUNTER — Ambulatory Visit: Payer: Medicare Other | Admitting: Dermatology

## 2022-02-18 ENCOUNTER — Ambulatory Visit: Payer: Medicare Other | Admitting: Dermatology

## 2022-02-18 VITALS — BP 166/72 | HR 61

## 2022-02-18 DIAGNOSIS — L820 Inflamed seborrheic keratosis: Secondary | ICD-10-CM

## 2022-02-18 DIAGNOSIS — L578 Other skin changes due to chronic exposure to nonionizing radiation: Secondary | ICD-10-CM

## 2022-02-18 DIAGNOSIS — C44622 Squamous cell carcinoma of skin of right upper limb, including shoulder: Secondary | ICD-10-CM | POA: Diagnosis not present

## 2022-02-18 DIAGNOSIS — D492 Neoplasm of unspecified behavior of bone, soft tissue, and skin: Secondary | ICD-10-CM

## 2022-02-18 DIAGNOSIS — L570 Actinic keratosis: Secondary | ICD-10-CM | POA: Diagnosis not present

## 2022-02-18 DIAGNOSIS — C4492 Squamous cell carcinoma of skin, unspecified: Secondary | ICD-10-CM

## 2022-02-18 HISTORY — DX: Squamous cell carcinoma of skin, unspecified: C44.92

## 2022-02-18 NOTE — Patient Instructions (Addendum)
Wound Care Instructions  Cleanse wound gently with soap and water once a day then pat dry with clean gauze. Apply a thin coat of Petrolatum (petroleum jelly, "Vaseline") over the wound (unless you have an allergy to this). We recommend that you use a new, sterile tube of Vaseline. Do not pick or remove scabs. Do not remove the yellow or white "healing tissue" from the base of the wound.  Cover the wound with fresh, clean, nonstick gauze and secure with paper tape. You may use Band-Aids in place of gauze and tape if the wound is small enough, but would recommend trimming much of the tape off as there is often too much. Sometimes Band-Aids can irritate the skin.  You should call the office for your biopsy report after 1 week if you have not already been contacted.  If you experience any problems, such as abnormal amounts of bleeding, swelling, significant bruising, significant pain, or evidence of infection, please call the office immediately.  FOR ADULT SURGERY PATIENTS: If you need something for pain relief you may take 1 extra strength Tylenol (acetaminophen) AND 2 Ibuprofen (200mg each) together every 4 hours as needed for pain. (do not take these if you are allergic to them or if you have a reason you should not take them.) Typically, you may only need pain medication for 1 to 3 days.     Cryotherapy Aftercare  Wash gently with soap and water everyday.   Apply Vaseline and Band-Aid daily until healed.    Due to recent changes in healthcare laws, you may see results of your pathology and/or laboratory studies on MyChart before the doctors have had a chance to review them. We understand that in some cases there may be results that are confusing or concerning to you. Please understand that not all results are received at the same time and often the doctors may need to interpret multiple results in order to provide you with the best plan of care or course of treatment. Therefore, we ask that you  please give us 2 business days to thoroughly review all your results before contacting the office for clarification. Should we see a critical lab result, you will be contacted sooner.   If You Need Anything After Your Visit  If you have any questions or concerns for your doctor, please call our main line at [phone] and press option 4 to reach your doctor's medical assistant. If no one answers, please leave a voicemail as directed and we will return your call as soon as possible. Messages left after 4 pm will be answered the following business day.   You may also send us a message via MyChart. We typically respond to MyChart messages within 1-2 business days.  For prescription refills, please ask your pharmacy to contact our office. Our fax number is [phone].  If you have an urgent issue when the clinic is closed that cannot wait until the next business day, you can page your doctor at the number below.    Please note that while we do our best to be available for urgent issues outside of office hours, we are not available 24/7.   If you have an urgent issue and are unable to reach us, you may choose to seek medical care at your doctor's office, retail clinic, urgent care center, or emergency room.  If you have a medical emergency, please immediately call 911 or go to the emergency department.  Pager Numbers  - Dr. Kowalski: [phone]  -   Dr. Moye: [phone]  - Dr. Stewart: [phone]  In the event of inclement weather, please call our main line at [phone] for an update on the status of any delays or closures.  Dermatology Medication Tips: Please keep the boxes that topical medications come in in order to help keep track of the instructions about where and how to use these. Pharmacies typically print the medication instructions only on the boxes and not directly on the medication tubes.   If your medication is too expensive, please contact our office at  [phone] option 4 or send us a message through MyChart.   We are unable to tell what your co-pay for medications will be in advance as this is different depending on your insurance coverage. However, we may be able to find a substitute medication at lower cost or fill out paperwork to get insurance to cover a needed medication.   If a prior authorization is required to get your medication covered by your insurance company, please allow us 1-2 business days to complete this process.  Drug prices often vary depending on where the prescription is filled and some pharmacies may offer cheaper prices.  The website www.goodrx.com contains coupons for medications through different pharmacies. The prices here do not account for what the cost may be with help from insurance (it may be cheaper with your insurance), but the website can give you the price if you did not use any insurance.  - You can print the associated coupon and take it with your prescription to the pharmacy.  - You may also stop by our office during regular business hours and pick up a GoodRx coupon card.  - If you need your prescription sent electronically to a different pharmacy, notify our office through [facility] MyChart or by phone at [phone] option 4.     Si Usted Necesita Algo Despus de Su Visita  Tambin puede enviarnos un mensaje a travs de MyChart. Por lo general respondemos a los mensajes de MyChart en el transcurso de 1 a 2 das hbiles.  Para renovar recetas, por favor pida a su farmacia que se ponga en contacto con nuestra oficina. Nuestro nmero de fax es el [phone].  Si tiene un asunto urgente cuando la clnica est cerrada y que no puede esperar hasta el siguiente da hbil, puede llamar/localizar a su doctor(a) al nmero que aparece a continuacin.   Por favor, tenga en cuenta que aunque hacemos todo lo posible para estar disponibles para asuntos urgentes fuera del horario de oficina, no estamos  disponibles las 24 horas del da, los 7 das de la semana.   Si tiene un problema urgente y no puede comunicarse con nosotros, puede optar por buscar atencin mdica  en el consultorio de su doctor(a), en una clnica privada, en un centro de atencin urgente o en una sala de emergencias.  Si tiene una emergencia mdica, por favor llame inmediatamente al 911 o vaya a la sala de emergencias.  Nmeros de bper  - Dr. Kowalski: [phone]  - Dra. Moye: [phone]  - Dra. Stewart: [phone]  En caso de inclemencias del tiempo, por favor llame a nuestra lnea principal al [phone] para una actualizacin sobre el estado de cualquier retraso o cierre.  Consejos para la medicacin en dermatologa: Por favor, guarde las cajas en las que vienen los medicamentos de uso tpico para ayudarle a seguir las instrucciones sobre dnde y cmo usarlos. Las farmacias generalmente imprimen las instrucciones del medicamento slo en las cajas y   no directamente en los tubos del medicamento.   Si su medicamento es muy caro, por favor, pngase en contacto con nuestra oficina llamando al [phone] y presione la opcin 4 o envenos un mensaje a travs de MyChart.   No podemos decirle cul ser su copago por los medicamentos por adelantado ya que esto es diferente dependiendo de la cobertura de su seguro. Sin embargo, es posible que podamos encontrar un medicamento sustituto a menor costo o llenar un formulario para que el seguro cubra el medicamento que se considera necesario.   Si se requiere una autorizacin previa para que su compaa de seguros cubra su medicamento, por favor permtanos de 1 a 2 das hbiles para completar este proceso.  Los precios de los medicamentos varan con frecuencia dependiendo del lugar de dnde se surte la receta y alguna farmacias pueden ofrecer precios ms baratos.  El sitio web www.goodrx.com tiene cupones para medicamentos de diferentes farmacias. Los precios aqu no  tienen en cuenta lo que podra costar con la ayuda del seguro (puede ser ms barato con su seguro), pero el sitio web puede darle el precio si no utiliz ningn seguro.  - Puede imprimir el cupn correspondiente y llevarlo con su receta a la farmacia.  - Tambin puede pasar por nuestra oficina durante el horario de atencin regular y recoger una tarjeta de cupones de GoodRx.  - Si necesita que su receta se enve electrnicamente a una farmacia diferente, informe a nuestra oficina a travs de MyChart de [place] o por telfono llamando al [phone] y presione la opcin 4.  

## 2022-02-18 NOTE — Progress Notes (Signed)
Follow-Up Visit   Subjective  Ann Hebert is a 87 y.o. female who presents for the following: check spots (L ear, scaly spot/R arm growth/Spots L arm/Tags chest itch). The patient has spots, moles and lesions to be evaluated, some may be new or changing and the patient has concerns that these could be cancer.   The following portions of the chart were reviewed this encounter and updated as appropriate:   Tobacco  Allergies  Meds  Problems  Med Hx  Surg Hx  Fam Hx     Review of Systems:  No other skin or systemic complaints except as noted in HPI or Assessment and Plan.  Objective  Well appearing patient in no apparent distress; mood and affect are within normal limits.  A focused examination was performed including face, chest, arms. Relevant physical exam findings are noted in the Assessment and Plan.  R prox lat forearm Hyperkeratotic pap 1.6cm     face, chest, arms, hands x 25 (25) Pink scaly macules  face, chest, arms, hands x 20 (20) Stuck on waxy paps with erythema   Assessment & Plan   Actinic Damage - chronic, secondary to cumulative UV radiation exposure/sun exposure over time - diffuse scaly erythematous macules with underlying dyspigmentation - Recommend daily broad spectrum sunscreen SPF 30+ to sun-exposed areas, reapply every 2 hours as needed.  - Recommend staying in the shade or wearing long sleeves, sun glasses (UVA+UVB protection) and wide brim hats (4-inch brim around the entire circumference of the hat). - Call for new or changing lesions.   Neoplasm of skin R prox lat forearm  Epidermal / dermal shaving  Lesion diameter (cm):  1.6 Informed consent: discussed and consent obtained   Timeout: patient name, date of birth, surgical site, and procedure verified   Procedure prep:  Patient was prepped and draped in usual sterile fashion Prep type:  Isopropyl alcohol Anesthesia: the lesion was anesthetized in a standard fashion   Anesthetic:   1% lidocaine w/ epinephrine 1-100,000 buffered w/ 8.4% NaHCO3 Instrument used: flexible razor blade   Hemostasis achieved with: pressure, aluminum chloride and electrodesiccation   Outcome: patient tolerated procedure well   Post-procedure details: sterile dressing applied and wound care instructions given   Dressing type: bandage and bacitracin    Destruction of lesion Complexity: simple   Destruction method: cryotherapy   Informed consent: discussed and consent obtained   Timeout:  patient name, date of birth, surgical site, and procedure verified Lesion destroyed using liquid nitrogen: Yes   Region frozen until ice ball extended beyond lesion: Yes   Lesion length (cm):  1.6 Lesion width (cm):  1.6 Final wound size (cm):  2.1 Outcome: patient tolerated procedure well with no complications   Post-procedure details: wound care instructions given    Specimen 1 - Surgical pathology Differential Diagnosis: D48.5 R/O SCC  Check Margins: yes Hyperkeratotic pap 1.6cm EDC  AK (actinic keratosis) (25) face, chest, arms, hands x 25  Destruction of lesion - face, chest, arms, hands x 25 Complexity: simple   Destruction method: cryotherapy   Informed consent: discussed and consent obtained   Timeout:  patient name, date of birth, surgical site, and procedure verified Lesion destroyed using liquid nitrogen: Yes   Region frozen until ice ball extended beyond lesion: Yes   Outcome: patient tolerated procedure well with no complications   Post-procedure details: wound care instructions given    Related Medications fluorouracil (EFUDEX) 5 % cream Apply topically 2 (two) times daily.  Bid to forehead and temples for 7 days, then in February treat cheeks bid for 7 days  Inflamed seborrheic keratosis (20) face, chest, arms, hands x 20  Symptomatic, irritating, patient would like treated.   Destruction of lesion - face, chest, arms, hands x 20 Complexity: simple   Destruction method:  cryotherapy   Informed consent: discussed and consent obtained   Timeout:  patient name, date of birth, surgical site, and procedure verified Lesion destroyed using liquid nitrogen: Yes   Region frozen until ice ball extended beyond lesion: Yes   Outcome: patient tolerated procedure well with no complications   Post-procedure details: wound care instructions given     Return in about 4 months (around 06/20/2022) for AK f/u, ISK f/u, both face, chest, arms, hands.  I, Othelia Pulling, RMA, am acting as scribe for Sarina Ser, MD . Documentation: I have reviewed the above documentation for accuracy and completeness, and I agree with the above.  Sarina Ser, MD

## 2022-02-26 ENCOUNTER — Telehealth: Payer: Self-pay

## 2022-02-26 NOTE — Telephone Encounter (Signed)
-----   Message from Ralene Bathe, MD sent at 02/26/2022  3:01 PM EST ----- Diagnosis Skin , right prox lat forearm WELL DIFFERENTIATED SQUAMOUS CELL CARCINOMA, CLOSE TO MARGIN  Cancer - SCC Already treated Recheck next visit

## 2022-03-01 ENCOUNTER — Encounter: Payer: Self-pay | Admitting: Dermatology

## 2022-03-02 ENCOUNTER — Telehealth: Payer: Self-pay

## 2022-03-02 NOTE — Telephone Encounter (Signed)
Patient advised of BX results .aw 

## 2022-03-02 NOTE — Telephone Encounter (Signed)
-----   Message from Ralene Bathe, MD sent at 02/26/2022  3:01 PM EST ----- Diagnosis Skin , right prox lat forearm WELL DIFFERENTIATED SQUAMOUS CELL CARCINOMA, CLOSE TO MARGIN  Cancer - SCC Already treated Recheck next visit

## 2022-06-11 ENCOUNTER — Ambulatory Visit: Payer: Medicare Other | Admitting: Dermatology

## 2022-06-11 VITALS — BP 131/62

## 2022-06-11 DIAGNOSIS — Z79899 Other long term (current) drug therapy: Secondary | ICD-10-CM | POA: Diagnosis not present

## 2022-06-11 DIAGNOSIS — L578 Other skin changes due to chronic exposure to nonionizing radiation: Secondary | ICD-10-CM

## 2022-06-11 DIAGNOSIS — D0461 Carcinoma in situ of skin of right upper limb, including shoulder: Secondary | ICD-10-CM | POA: Diagnosis not present

## 2022-06-11 DIAGNOSIS — D485 Neoplasm of uncertain behavior of skin: Secondary | ICD-10-CM

## 2022-06-11 DIAGNOSIS — Z5111 Encounter for antineoplastic chemotherapy: Secondary | ICD-10-CM

## 2022-06-11 DIAGNOSIS — D099 Carcinoma in situ, unspecified: Secondary | ICD-10-CM

## 2022-06-11 DIAGNOSIS — L570 Actinic keratosis: Secondary | ICD-10-CM | POA: Diagnosis not present

## 2022-06-11 DIAGNOSIS — L814 Other melanin hyperpigmentation: Secondary | ICD-10-CM | POA: Diagnosis not present

## 2022-06-11 DIAGNOSIS — C44622 Squamous cell carcinoma of skin of right upper limb, including shoulder: Secondary | ICD-10-CM

## 2022-06-11 DIAGNOSIS — L820 Inflamed seborrheic keratosis: Secondary | ICD-10-CM

## 2022-06-11 HISTORY — DX: Carcinoma in situ, unspecified: D09.9

## 2022-06-11 NOTE — Progress Notes (Unsigned)
Follow-Up Visit   Subjective  Ann Hebert is a 88 y.o. female who presents for the following: Biopsy follow up of right forearm - SCC treated with [facility] 02/18/22. AK follow up of face, arms, hands and chest treated with LN2. She has a hard spot on her right forearm that she picks at all the time. The patient has spots, moles and lesions to be evaluated, some may be new or changing and the patient has concerns that these could be cancer.  The following portions of the chart were reviewed this encounter and updated as appropriate: medications, allergies, medical history  Review of Systems:  No other skin or systemic complaints except as noted in HPI or Assessment and Plan.  Objective  Well appearing patient in no apparent distress; mood and affect are within normal limits.  A focused examination was performed to face, neck, chest, arms, legs. All findings within normal limits unless otherwise noted below.    Relevant exam findings are noted in the Assessment and Plan.  Right Forearm - Posterior 0.7 cm hypertrophic papule   Assessment & Plan   ACTINIC KERATOSIS Exam: Hyperkeratotic pink papules  Actinic keratoses are precancerous spots that appear secondary to cumulative UV radiation exposure/sun exposure over time. They are chronic with expected duration over 1 year. A portion of actinic keratoses will progress to squamous cell carcinoma of the skin. It is not possible to reliably predict which spots will progress to skin cancer and so treatment is recommended to prevent development of skin cancer.  Recommend daily broad spectrum sunscreen SPF 30+ to sun-exposed areas, reapply every 2 hours as needed.  Recommend staying in the shade or wearing long sleeves, sun glasses (UVA+UVB protection) and wide brim hats (4-inch brim around the entire circumference of the hat). Call for new or changing lesions.  Treatment Plan:  Prior to procedure, discussed risks of blister formation, small  wound, skin dyspigmentation, or rare scar following cryotherapy. Recommend Vaseline ointment to treated areas while healing.  Destruction Procedure Note Destruction method: cryotherapy   Informed consent: discussed and consent obtained   Lesion destroyed using liquid nitrogen: Yes   Outcome: patient tolerated procedure well with no complications   Post-procedure details: wound care instructions given   Locations: face, arms, hands # of Lesions Treated: 23   Neoplasm of uncertain behavior of skin Right Forearm - Posterior  Epidermal / dermal shaving  Lesion diameter (cm):  0.7 Informed consent: discussed and consent obtained   Timeout: patient name, date of birth, surgical site, and procedure verified   Procedure prep:  Patient was prepped and draped in usual sterile fashion Prep type:  Isopropyl alcohol Anesthesia: the lesion was anesthetized in a standard fashion   Anesthetic:  1% lidocaine w/ epinephrine 1-100,000 buffered w/ 8.4% NaHCO3 Instrument used: flexible razor blade   Hemostasis achieved with: pressure, aluminum chloride and electrodesiccation   Outcome: patient tolerated procedure well   Post-procedure details: sterile dressing applied and wound care instructions given   Dressing type: bandage and petrolatum    Destruction of lesion Complexity: extensive   Destruction method: electrodesiccation and curettage   Informed consent: discussed and consent obtained   Timeout:  patient name, date of birth, surgical site, and procedure verified Procedure prep:  Patient was prepped and draped in usual sterile fashion Prep type:  Isopropyl alcohol Anesthesia: the lesion was anesthetized in a standard fashion   Anesthetic:  1% lidocaine w/ epinephrine 1-100,000 buffered w/ 8.4% NaHCO3 Curettage performed in three different  directions: Yes   Electrodesiccation performed over the curetted area: Yes   Lesion length (cm):  0.7 Lesion width (cm):  0.7 Margin per side (cm):   0.2 Final wound size (cm):  1.1 Hemostasis achieved with:  pressure and aluminum chloride Outcome: patient tolerated procedure well with no complications   Post-procedure details: sterile dressing applied and wound care instructions given   Dressing type: bandage and petrolatum    Specimen 1 - Surgical pathology Differential Diagnosis: SCC vs other  Check Margins: No EDC today  Actinic keratosis  Inflamed seborrheic keratosis  Chemotherapy management, encounter for  Actinic skin damage  Medication management  Lentigo  ACTINIC DAMAGE WITH PRECANCEROUS ACTINIC KERATOSES Counseling for Topical Chemotherapy Management: Patient exhibits: - Severe, confluent actinic changes with pre-cancerous actinic keratoses that is secondary to cumulative UV radiation exposure over time - Condition that is severe; chronic, not at goal. - diffuse scaly erythematous macules and papules with underlying dyspigmentation - Discussed Prescription "Field Treatment" topical Chemotherapy for Severe, Chronic Confluent Actinic Changes with Pre-Cancerous Actinic Keratoses Field treatment involves treatment of an entire area of skin that has confluent Actinic Changes (Sun/ Ultraviolet light damage) and PreCancerous Actinic Keratoses by method of PhotoDynamic Therapy (PDT) and/or prescription Topical Chemotherapy agents such as 5-fluorouracil, 5-fluorouracil/calcipotriene, and/or imiquimod.  The purpose is to decrease the number of clinically evident and subclinical PreCancerous lesions to prevent progression to development of skin cancer by chemically destroying early precancer changes that may or may not be visible.  It has been shown to reduce the risk of developing skin cancer in the treated area. As a result of treatment, redness, scaling, crusting, and open sores may occur during treatment course. One or more than one of these methods may be used and may have to be used several times to control, suppress and  eliminate the PreCancerous changes. Discussed treatment course, expected reaction, and possible side effects. - Recommend daily broad spectrum sunscreen SPF 30+ to sun-exposed areas, reapply every 2 hours as needed.  - Staying in the shade or wearing long sleeves, sun glasses (UVA+UVB protection) and wide brim hats (4-inch brim around the entire circumference of the hat) are also recommended. - Call for new or changing lesions. - - Start 5-fluorouracil/calcipotriene cream twice a day for 7 days to affected areas including temples, cheeks. Prescription sent to [facility]. Patient advised they will receive an email to purchase the medication online and have it sent to their home. Patient provided with handout reviewing treatment course and side effects and advised to call or message [place] on MyChart with any concerns.    INFLAMED SEBORRHEIC KERATOSIS Exam: Erythematous keratotic or waxy stuck-on papule or plaque.  Symptomatic, irritating, patient would like treated.  Benign-appearing.  Call clinic for new or changing lesions.   Prior to procedure, discussed risks of blister formation, small wound, skin dyspigmentation, or rare scar following treatment. Recommend Vaseline ointment to treated areas while healing.  Destruction Procedure Note Destruction method: cryotherapy   Informed consent: discussed and consent obtained   Lesion destroyed using liquid nitrogen: Yes   Outcome: patient tolerated procedure well with no complications   Post-procedure details: wound care instructions given   Locations: arms, hands, legs # of Lesions Treated: 22  Lentigines - Scattered tan macules - Due to sun exposure - Benign-appearing, observe - Recommend daily broad spectrum sunscreen SPF 30+ to sun-exposed areas, reapply every 2 hours as needed. - Call for any changes  Return in about 4 months (around 10/11/2022)  for AK follow up, Biopsy Follow up.  I, Ashok Cordia, CMA, am acting  as scribe for Sarina Ser, MD .  Documentation: I have reviewed the above documentation for accuracy and completeness, and I agree with the above.  Sarina Ser, MD

## 2022-06-11 NOTE — Patient Instructions (Signed)
Instructions for [facility]  One or more of your medications was sent to the [facility]. You will receive an email from them and can purchase the medicine through that link. It will then be mailed to your home at the address you confirmed. If for any reason you do not receive an email from them, please check your spam folder. If you still do not find the email, please let us know. [facility] phone number is [phone].   Cryotherapy Aftercare  Wash gently with soap and water everyday.   Apply Vaseline and Band-Aid daily until healed.    Shave Excision Benign Lesion Wound Care Instructions  Leave the original bandage on for 24 hours if possible.  If the bandage becomes soaked or soiled before that time, it is OK to remove it and examine the wound.  A small amount of post-operative bleeding is normal.  If excessive bleeding occurs, remove the bandage, place gauze over the site and apply continuous pressure (no peeking) over the area for 20-30 minutes.  If this does not stop the bleeding, try again for 40 minutes.  If this does not work, please call our clinic as soon as possible (even if after-hours).    Twice a day, cleanse the wound with soap and water.  If a thick crust develops you may use a Q-tip dipped into dilute hydrogen peroxide (mix 1:1 with water) to dissolve it.  Hydrogen peroxide can slow the healing process, so use it only as needed.  After washing, apply Vaseline jelly or Polysporin ointment.  For best healing, the wound should be covered with a layer of ointment at all times.  This may mean re-applying the ointment several times a day.  For open wounds, continue until it has healed.    If you have any swelling, keep the area elevated.  Some redness, tenderness and white or yellow material in the wound is normal healing.  If the area becomes very sore and red, or develops a thick yellow-green material (pus), it may be  infected; please notify us.    Wound healing continues for up to one year following surgery.  It is not unusual to experience pain in the scar from time to time during the interval.  If the pain becomes severe or the scar thickens, you should notify the office.  A slight amount of redness in a scar is expected for the first six months.  After six months, the redness subsides and the scar will soften and fade.  The color difference becomes less noticeable with time.  If there are any problems, return for a post-op surgery check at your earliest convenience.  Please call our office for any questions or concerns.    Due to recent changes in healthcare laws, you may see results of your pathology and/or laboratory studies on MyChart before the doctors have had a chance to review them. We understand that in some cases there may be results that are confusing or concerning to you. Please understand that not all results are received at the same time and often the doctors may need to interpret multiple results in order to provide you with the best plan of care or course of treatment. Therefore, we ask that you please give [place] 2 business days to thoroughly review all your results before contacting the office for clarification. Should we see a critical lab result, you will be contacted sooner.   If You Need Anything After Your Visit  If you have any questions or concerns for your doctor, please call our main line at [phone] and press option 4 to reach your doctor's medical assistant. If no one answers, please leave a voicemail as directed and we will return your call as soon as possible. Messages left after 4 pm will be answered the following business day.   You may also send [place] a message via [place]. We typically respond to MyChart messages within 1-2 business days.  For prescription refills, please ask your pharmacy to contact our office. Our fax number is [phone].  If you have an urgent issue when  the clinic is closed that cannot wait until the next business day, you can page your doctor at the number below.    Please note that while we do our best to be available for urgent issues outside of office hours, we are not available 24/7.   If you have an urgent issue and are unable to reach [place], you may choose to seek medical care at your doctor's office, retail clinic, urgent care center, or emergency room.  If you have a medical emergency, please immediately call 911 or go to the emergency department.  Pager Numbers  - Dr. Nehemiah Massed: [phone]  - Dr. Laurence Ferrari: [phone]  - Dr. Nicole Kindred: [phone]  In the event of inclement weather, please call our main line at [phone] for an update on the status of any delays or closures.  Dermatology Medication Tips: Please keep the boxes that topical medications come in in order to help keep track of the instructions about where and how to use these. Pharmacies typically print the medication instructions only on the boxes and not directly on the medication tubes.   If your medication is too expensive, please contact our office at [phone] option 4 or send [place] a message through [place].   We are unable to tell what your co-pay for medications will be in advance as this is different depending on your insurance coverage. However, we may be able to find a substitute medication at lower cost or fill out paperwork to get insurance to cover a needed medication.   If a prior authorization is required to get your medication covered by your insurance company, please allow [place] 1-2 business days to complete this process.  Drug prices often vary depending on where the prescription is filled and some pharmacies may offer cheaper prices.  The website www.goodrx.com contains coupons for medications through different pharmacies. The prices here do not account for what the cost may be with help from insurance (it may be cheaper with your insurance), but the  website can give you the price if you did not use any insurance.  - You can print the associated coupon and take it with your prescription to the pharmacy.  - You may also stop by our office during regular business hours and pick up a GoodRx coupon card.  - If you need your prescription sent electronically to a different pharmacy, notify our office through [facility] or by phone at [phone] option 4.     Si Usted Necesita Algo Despus de Su Visita  Tambin puede enviarnos un mensaje a travs de Pharmacist, community. Por lo general respondemos a los mensajes de MyChart en el transcurso de 1 a 2 das hbiles.  Para renovar recetas, por favor pida a su farmacia que se ponga en contacto con nuestra oficina. Harland Dingwall de fax es [place] [phone].  Si tiene un asunto urgente cuando la clnica est  cerrada y que no puede esperar hasta el siguiente da hbil, puede llamar/localizar a su doctor(a) al nmero que aparece a continuacin.   Por favor, tenga en cuenta que aunque hacemos todo lo posible para estar disponibles para asuntos urgentes fuera del horario de [place], no estamos disponibles las 24 horas del da, los 7 das de la [place].   Si tiene un problema urgente y no puede comunicarse con nosotros, puede optar por buscar atencin mdica  en el consultorio de su doctor(a), en una clnica privada, en un centro de atencin urgente o en una sala de emergencias.  Si tiene Engineering geologist, por favor llame inmediatamente al 911 o vaya a la sala de emergencias.  Nmeros de bper  - Dr. Nehemiah Massed: [phone]  - Dra. Moye: [phone]  - Dra. Nicole Kindred: [phone]  En caso de inclemencias del [place], por favor llame a Johnsie Kindred principal al [phone] para una actualizacin sobre el [place] de cualquier retraso o cierre.  Consejos para la medicacin en dermatologa: Por favor, guarde las cajas en las que vienen los medicamentos de uso tpico para ayudarle a seguir las  instrucciones sobre dnde y cmo usarlos. Las farmacias generalmente imprimen las instrucciones del medicamento slo en las cajas y no directamente en los tubos del [place].   Si su medicamento es muy caro, por favor, pngase en contacto con Zigmund Daniel llamando al [phone] y presione la opcin 4 o envenos un mensaje a travs de Pharmacist, community.   No podemos decirle cul ser su copago por los medicamentos por adelantado ya que esto es diferente dependiendo de la cobertura de su seguro. Sin embargo, es posible que podamos encontrar un medicamento sustituto a Electrical engineer un formulario para que el seguro cubra el medicamento que se considera necesario.   Si se requiere una autorizacin previa para que su compaa de seguros [place] su medicamento, por favor permtanos de 1 a 2 das hbiles para completar este proceso.  Los precios de los medicamentos varan con frecuencia dependiendo del Environmental consultant de dnde se surte la receta y alguna farmacias pueden ofrecer precios ms baratos.  El sitio web www.goodrx.com tiene cupones para medicamentos de Airline pilot. Los precios aqu no tienen en cuenta lo que podra costar con la ayuda del seguro (puede ser ms barato con su seguro), pero el sitio web puede darle el precio si no utiliz Research scientist (physical sciences).  - Puede imprimir el cupn correspondiente y llevarlo con su receta a la farmacia.  - Tambin puede pasar por nuestra oficina durante el horario de atencin regular y Charity fundraiser una tarjeta de cupones de GoodRx.  - Si necesita que su receta se enve electrnicamente a una farmacia diferente, informe a nuestra oficina a travs de MyChart de [place] o por telfono llamando al [phone] y presione la opcin 4.

## 2022-06-16 ENCOUNTER — Encounter: Payer: Self-pay | Admitting: Dermatology

## 2022-06-22 ENCOUNTER — Telehealth: Payer: Self-pay

## 2022-06-22 NOTE — Telephone Encounter (Signed)
LM on VM please return my call to discuss biopsy results  

## 2022-06-23 ENCOUNTER — Telehealth: Payer: Self-pay

## 2022-06-23 NOTE — Telephone Encounter (Signed)
-----   Message from Deirdre Evener, MD sent at 06/17/2022  1:22 PM EDT ----- Diagnosis Skin , right forearm - posterior SQUAMOUS CELL CARCINOMA IN SITU, BASE INVOLVED  Cancer -SCC in situ Superficial Already treated Check next visit

## 2022-06-23 NOTE — Telephone Encounter (Signed)
Patient left nurse message to please leave BX results on VM as her house phone has been messed up and she has lost her cell.  Left detailed message today. aw

## 2022-07-23 NOTE — Telephone Encounter (Signed)
Patient called back today to speak with me.  Left message on pts house phone to return my call. aw

## 2022-10-14 ENCOUNTER — Encounter: Payer: Self-pay | Admitting: Dermatology

## 2022-10-14 ENCOUNTER — Ambulatory Visit: Payer: Medicare Other | Admitting: Dermatology

## 2022-10-14 VITALS — BP 147/77 | HR 60

## 2022-10-14 DIAGNOSIS — L578 Other skin changes due to chronic exposure to nonionizing radiation: Secondary | ICD-10-CM

## 2022-10-14 DIAGNOSIS — D692 Other nonthrombocytopenic purpura: Secondary | ICD-10-CM

## 2022-10-14 DIAGNOSIS — L820 Inflamed seborrheic keratosis: Secondary | ICD-10-CM

## 2022-10-14 DIAGNOSIS — Z5111 Encounter for antineoplastic chemotherapy: Secondary | ICD-10-CM

## 2022-10-14 DIAGNOSIS — W57XXXA Bitten or stung by nonvenomous insect and other nonvenomous arthropods, initial encounter: Secondary | ICD-10-CM

## 2022-10-14 DIAGNOSIS — C44722 Squamous cell carcinoma of skin of right lower limb, including hip: Secondary | ICD-10-CM

## 2022-10-14 DIAGNOSIS — L814 Other melanin hyperpigmentation: Secondary | ICD-10-CM

## 2022-10-14 DIAGNOSIS — Z7189 Other specified counseling: Secondary | ICD-10-CM

## 2022-10-14 DIAGNOSIS — W908XXA Exposure to other nonionizing radiation, initial encounter: Secondary | ICD-10-CM

## 2022-10-14 DIAGNOSIS — L821 Other seborrheic keratosis: Secondary | ICD-10-CM

## 2022-10-14 DIAGNOSIS — L570 Actinic keratosis: Secondary | ICD-10-CM

## 2022-10-14 DIAGNOSIS — Z86007 Personal history of in-situ neoplasm of skin: Secondary | ICD-10-CM

## 2022-10-14 DIAGNOSIS — D492 Neoplasm of unspecified behavior of bone, soft tissue, and skin: Secondary | ICD-10-CM

## 2022-10-14 DIAGNOSIS — S90561A Insect bite (nonvenomous), right ankle, initial encounter: Secondary | ICD-10-CM

## 2022-10-14 DIAGNOSIS — Z8589 Personal history of malignant neoplasm of other organs and systems: Secondary | ICD-10-CM

## 2022-10-14 DIAGNOSIS — Z79899 Other long term (current) drug therapy: Secondary | ICD-10-CM

## 2022-10-14 NOTE — Patient Instructions (Addendum)
Cryotherapy Aftercare  Wash gently with soap and water everyday.   Apply Vaseline Jelly daily until healed.   Wound Care Instructions  Cleanse wound gently with soap and water once a day then pat dry with clean gauze. Apply a thin coat of Petrolatum (petroleum jelly, "Vaseline") over the wound (unless you have an allergy to this). We recommend that you use a new, sterile tube of Vaseline. Do not pick or remove scabs. Do not remove the yellow or white "healing tissue" from the base of the wound.  Cover the wound with fresh, clean, nonstick gauze and secure with paper tape. You may use Band-Aids in place of gauze and tape if the wound is small enough, but would recommend trimming much of the tape off as there is often too much. Sometimes Band-Aids can irritate the skin.  You should call the office for your biopsy report after 1 week if you have not already been contacted.  If you experience any problems, such as abnormal amounts of bleeding, swelling, significant bruising, significant pain, or evidence of infection, please call the office immediately.  FOR ADULT SURGERY PATIENTS: If you need something for pain relief you may take 1 extra strength Tylenol (acetaminophen) AND 2 Ibuprofen (200mg  each) together every 4 hours as needed for pain. (do not take these if you are allergic to them or if you have a reason you should not take them.) Typically, you may only need pain medication for 1 to 3 days.    Due to recent changes in healthcare laws, you may see results of your pathology and/or laboratory studies on MyChart before the doctors have had a chance to review them. We understand that in some cases there may be results that are confusing or concerning to you. Please understand that not all results are received at the same time and often the doctors may need to interpret multiple results in order to provide you with the best plan of care or course of treatment. Therefore, we ask that you please give  [place] 2 business days to thoroughly review all your results before contacting the office for clarification. Should we see a critical lab result, you will be contacted sooner.   If You Need Anything After Your Visit  If you have any questions or concerns for your doctor, please call our main line at [phone] and press option 4 to reach your doctor's medical assistant. If no one answers, please leave a voicemail as directed and we will return your call as soon as possible. Messages left after 4 pm will be answered the following business day.   You may also send [place] a message via MyChart. We typically respond to MyChart messages within 1-2 business days.  For prescription refills, please ask your pharmacy to contact our office. Our fax number is [phone].  If you have an urgent issue when the clinic is closed that cannot wait until the next business day, you can page your doctor at the number below.    Please note that while we do our best to be available for urgent issues outside of office hours, we are not available 24/7.   If you have an urgent issue and are unable to reach [place], you may choose to seek medical care at your doctor's office, retail clinic, urgent care center, or emergency room.  If you have a medical emergency, please immediately call 911 or go to the emergency department.  Pager Numbers  - Dr. Gwen Pounds: [phone]  - Dr. Neale Burly:  [phone]  - Dr. Roseanne Reno: [phone]  In the event of inclement weather, please call our main line at [phone] for an update on the status of any delays or closures.  Dermatology Medication Tips: Please keep the boxes that topical medications come in in order to help keep track of the instructions about where and how to use these. Pharmacies typically print the medication instructions only on the boxes and not directly on the medication tubes.   If your medication is too expensive, please contact our office at [phone] option 4  or send [place] a message through MyChart.   We are unable to tell what your co-pay for medications will be in advance as this is different depending on your insurance coverage. However, we may be able to find a substitute medication at lower cost or fill out paperwork to get insurance to cover a needed medication.   If a prior authorization is required to get your medication covered by your insurance company, please allow [place] 1-2 business days to complete this process.  Drug prices often vary depending on where the prescription is filled and some pharmacies may offer cheaper prices.  The website www.goodrx.com contains coupons for medications through different pharmacies. The prices here do not account for what the cost may be with help from insurance (it may be cheaper with your insurance), but the website can give you the price if you did not use any insurance.  - You can print the associated coupon and take it with your prescription to the pharmacy.  - You may also stop by our office during regular business hours and pick up a GoodRx coupon card.  - If you need your prescription sent electronically to a different pharmacy, notify our office through [facility] or by phone at [phone] option 4.     Si Usted Necesita Algo Despus de Su Visita  Tambin puede enviarnos un mensaje a travs de Clinical cytogeneticist. Por lo general respondemos a los mensajes de MyChart en el transcurso de 1 a 2 das hbiles.  Para renovar recetas, por favor pida a su farmacia que se ponga en contacto con nuestra oficina. Annie Sable de fax es [place] [phone].  Si tiene un asunto urgente cuando la clnica est cerrada y que no puede esperar hasta el siguiente da hbil, puede llamar/localizar a su doctor(a) al nmero que aparece a continuacin.   Por favor, tenga en cuenta que aunque hacemos todo lo posible para estar disponibles para asuntos urgentes fuera del horario de [place], no estamos disponibles las 24 horas  del da, los 7 [address]  [address] de la [place].   Si tiene un problema urgente y no puede comunicarse con nosotros, puede optar por buscar atencin mdica  en el consultorio de su doctor(a), en una clnica privada, en un centro de atencin urgente o en una sala de emergencias.  Si tiene Engineer, drilling, por favor llame inmediatamente al 911 o vaya a la sala de emergencias.  Nmeros de bper  - Dr. Gwen Pounds: [phone]  - Dra. Moye: [phone]  - Dra. Roseanne Reno: [phone]  En caso de inclemencias del [place], por favor llame a Lacy Duverney principal al [phone] para una actualizacin sobre el [place] de cualquier retraso o cierre.  Consejos para la medicacin en dermatologa: Por favor, guarde las cajas en las que vienen los medicamentos de uso tpico para ayudarle a seguir las instrucciones sobre dnde y cmo usarlos. Las farmacias generalmente imprimen las instrucciones del medicamento slo en las cajas y no directamente  en los tubos del medicamento.   Si su medicamento es muy caro, por favor, pngase en contacto con Rolm Gala llamando al [phone] y presione la opcin 4 o envenos un mensaje a travs de Clinical cytogeneticist.   No podemos decirle cul ser su copago por los medicamentos por adelantado ya que esto es diferente dependiendo de la cobertura de su seguro. Sin embargo, es posible que podamos encontrar un medicamento sustituto a Audiological scientist un formulario para que el seguro cubra el medicamento que se considera necesario.   Si se requiere una autorizacin previa para que su compaa de seguros [place] su medicamento, por favor permtanos de 1 a 2 das hbiles para completar [address]  Los precios de los medicamentos varan con frecuencia dependiendo del Environmental consultant de dnde se surte la receta y alguna farmacias pueden ofrecer precios ms baratos.  El sitio web www.goodrx.com tiene cupones para medicamentos de Health and safety inspector. Los precios aqu no tienen en cuenta lo que  podra costar con la ayuda del seguro (puede ser ms barato con su seguro), pero el sitio web puede darle el precio si no utiliz Tourist information centre manager.  - Puede imprimir el cupn correspondiente y llevarlo con su receta a la farmacia.  - Tambin puede pasar por nuestra oficina durante el horario de atencin regular y Education officer, museum una tarjeta de cupones de GoodRx.  - Si necesita que su receta se enve electrnicamente a una farmacia diferente, informe a nuestra oficina a travs de MyChart de [place] o por telfono llamando al [phone] y presione la opcin 4.

## 2022-10-14 NOTE — Progress Notes (Signed)
Follow-Up Visit   Subjective  Ann Hebert is a 88 y.o. female who presents for the following: AK and ISKs. 4 month follow up. Areas treated on face, arms, hands, legs.  Hx SCCis at right forearm. Bx and Tx with [facility] 06/11/2022.  Patient states she did not receive 5FU/Calcipotriene cream from [facility]. States her phone lines were not working properly and she never got the message.   The patient has spots, moles and lesions to be evaluated, some may be new or changing and the patient may have concern these could be cancer.   The following portions of the chart were reviewed this encounter and updated as appropriate: medications, allergies, medical history  Review of Systems:  No other skin or systemic complaints except as noted in HPI or Assessment and Plan.  Objective  Well appearing patient in no apparent distress; mood and affect are within normal limits.  A focused examination was performed of the following areas: Face, arms, hands, legs  Relevant exam findings are noted in the Assessment and Plan.  right prox lat dorsal forearm, face, arms and hands x29 (29) Erythematous thin papules/macules with gritty scale.   arms and hands x17 (17) Erythematous keratotic or waxy stuck-on papule or plaque.  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  - No evidence of recurrence today - Recommend regular full body skin exams - Recommend daily broad spectrum sunscreen SPF 30+ to sun-exposed areas, reapply every 2 hours as needed.  - Call if any new or changing lesions are noted between office visits   AK (actinic keratosis) (29) right prox lat dorsal forearm, face, arms and hands x29  Actinic keratoses are precancerous spots that appear secondary to cumulative UV radiation exposure/sun exposure over time. They are chronic with expected duration over 1 year. A  portion of actinic keratoses will progress to squamous cell carcinoma of the skin. It is not possible to reliably predict which spots will progress to skin cancer and so treatment is recommended to prevent development of skin cancer.  Recommend daily broad spectrum sunscreen SPF 30+ to sun-exposed areas, reapply every 2 hours as needed.  Recommend staying in the shade or wearing long sleeves, sun glasses (UVA+UVB protection) and wide brim hats (4-inch brim around the entire circumference of the hat). Call for new or changing lesions.   ACTINIC DAMAGE - chronic, secondary to cumulative UV radiation exposure/sun exposure over time - diffuse scaly erythematous macules with underlying dyspigmentation - Recommend daily broad spectrum sunscreen SPF 30+ to sun-exposed areas, reapply every 2 hours as needed.  - Recommend staying in the shade or wearing long sleeves, sun glasses (UVA+UVB protection) and wide brim hats (4-inch brim around the entire circumference of the hat). - Call for new or changing lesions.  Destruction of lesion - right prox lat dorsal forearm, face, arms and hands x29 (29) Complexity: simple   Destruction method: cryotherapy   Informed consent: discussed and consent obtained   Timeout:  patient name, date of birth, surgical site, and procedure verified Lesion destroyed using liquid nitrogen: Yes   Region frozen until ice ball extended beyond lesion: Yes   Outcome: patient tolerated procedure well with no complications   Post-procedure details: wound care instructions given   Additional details:  Prior to procedure, discussed risks of blister formation, small wound, skin dyspigmentation, or rare scar  following cryotherapy. Recommend Vaseline ointment to treated areas while healing.   Inflamed seborrheic keratosis (17) arms and hands x17  Symptomatic, irritating, patient would like treated.  Destruction of lesion - arms and hands x17 (17) Complexity: simple   Destruction  method: cryotherapy   Informed consent: discussed and consent obtained   Timeout:  patient name, date of birth, surgical site, and procedure verified Lesion destroyed using liquid nitrogen: Yes   Region frozen until ice ball extended beyond lesion: Yes   Outcome: patient tolerated procedure well with no complications   Post-procedure details: wound care instructions given   Additional details:  Prior to procedure, discussed risks of blister formation, small wound, skin dyspigmentation, or rare scar following cryotherapy. Recommend Vaseline ointment to treated areas while healing.   Neoplasm of skin right lateral calf  Epidermal / dermal shaving  Lesion diameter (cm):  1.2 Informed consent: discussed and consent obtained   Timeout: patient name, date of birth, surgical site, and procedure verified   Procedure prep:  Patient was prepped and draped in usual sterile fashion Prep type:  Isopropyl alcohol Anesthesia: the lesion was anesthetized in a standard fashion   Anesthetic:  1% lidocaine w/ epinephrine 1-100,000 buffered w/ 8.4% NaHCO3 Instrument used: flexible razor blade   Hemostasis achieved with: pressure, aluminum chloride and electrodesiccation   Outcome: patient tolerated procedure well   Post-procedure details: sterile dressing applied and wound care instructions given   Dressing type: bandage and petrolatum    Destruction of lesion Complexity: extensive   Destruction method: electrodesiccation and curettage   Informed consent: discussed and consent obtained   Timeout:  patient name, date of birth, surgical site, and procedure verified Procedure prep:  Patient was prepped and draped in usual sterile fashion Prep type:  Isopropyl alcohol Anesthesia: the lesion was anesthetized in a standard fashion   Anesthetic:  1% lidocaine w/ epinephrine 1-100,000 buffered w/ 8.4% NaHCO3 Curettage performed in three different directions: Yes   Electrodesiccation performed over the  curetted area: Yes   Curettage cycles:  3 Final wound size (cm):  1.2 Hemostasis achieved with:  pressure and aluminum chloride Outcome: patient tolerated procedure well with no complications   Post-procedure details: sterile dressing applied and wound care instructions given   Dressing type: bandage and petrolatum    Specimen 1 - Surgical pathology Differential Diagnosis: R/O SCC   Check Margins: No  Actinic skin damage  Lentigo  Seborrheic keratosis  Counseling and coordination of care  History of squamous cell carcinoma  Medication management  Chemotherapy management, encounter for  Purpura (HCC)   Purpura - Chronic; persistent and recurrent.  Treatable, but not curable. - Violaceous macules and patches - Benign - Related to trauma, age, sun damage and/or use of blood thinners, chronic use of topical and/or oral steroids - Observe - Can use OTC arnica containing moisturizer such as Dermend Bruise Formula if desired - Call for worsening or other concerns  ACTINIC DAMAGE WITH PRECANCEROUS ACTINIC KERATOSES Counseling for Topical Chemotherapy Management: Patient exhibits: - Severe, confluent actinic changes with pre-cancerous actinic keratoses that is secondary to cumulative UV radiation exposure over time - Condition that is severe; chronic, not at goal. - diffuse scaly erythematous macules and papules with underlying dyspigmentation - Discussed Prescription "Field Treatment" topical Chemotherapy for Severe, Chronic Confluent Actinic Changes with Pre-Cancerous Actinic Keratoses Field treatment involves treatment of an entire area of skin that has confluent Actinic Changes (Sun/ Ultraviolet light damage) and PreCancerous Actinic Keratoses by method of PhotoDynamic Therapy (  PDT) and/or prescription Topical Chemotherapy agents such as 5-fluorouracil, 5-fluorouracil/calcipotriene, and/or imiquimod.  The purpose is to decrease the number of clinically evident and subclinical  PreCancerous lesions to prevent progression to development of skin cancer by chemically destroying early precancer changes that may or may not be visible.  It has been shown to reduce the risk of developing skin cancer in the treated area. As a result of treatment, redness, scaling, crusting, and open sores may occur during treatment course. One or more than one of these methods may be used and may have to be used several times to control, suppress and eliminate the PreCancerous changes. Discussed treatment course, expected reaction, and possible side effects. - Recommend daily broad spectrum sunscreen SPF 30+ to sun-exposed areas, reapply every 2 hours as needed.  - Staying in the shade or wearing long sleeves, sun glasses (UVA+UVB protection) and wide brim hats (4-inch brim around the entire circumference of the hat) are also recommended. - Call for new or changing lesions.  - Recommend red light PDT with debridement to face.  Patient will schedule for early November.    Crusting/Insect bite  Exam: Right medial ankle with crusting secondary to insect bite.   Recheck at follow up.   Return for Recheck right ankle at PDT appointment. [place] PDT November.  I, Lawson Radar, CMA, am acting as scribe for Armida Sans, MD.  Documentation: I have reviewed the above documentation for accuracy and completeness, and I agree with the above.  Armida Sans, MD

## 2022-10-21 ENCOUNTER — Telehealth: Payer: Self-pay

## 2022-10-21 NOTE — Telephone Encounter (Signed)
Called patient. N/A. LMOVM to C/B for pathology results.

## 2022-10-21 NOTE — Telephone Encounter (Signed)
-----   Message from Armida Sans sent at 10/20/2022  6:23 PM EDT ----- Diagnosis Skin , right lateral calf WELL DIFFERENTIATED SQUAMOUS CELL CARCINOMA  Cancer = SCC Already treated Recheck next visit

## 2022-10-22 NOTE — Telephone Encounter (Signed)
Patient advised of BX results .aw 

## 2023-01-27 ENCOUNTER — Ambulatory Visit: Payer: Medicare Other
